# Patient Record
Sex: Female | Born: 1950 | Race: White | Hispanic: No | Marital: Married | Smoking: Never smoker
Health system: Southern US, Community
[De-identification: ages and names within clinical notes are randomized; demographics above are authoritative.]

## PROBLEM LIST (undated history)

## (undated) DIAGNOSIS — G4700 Insomnia, unspecified: Secondary | ICD-10-CM

## (undated) DIAGNOSIS — H04129 Dry eye syndrome of unspecified lacrimal gland: Secondary | ICD-10-CM

## (undated) DIAGNOSIS — E785 Hyperlipidemia, unspecified: Secondary | ICD-10-CM

## (undated) DIAGNOSIS — E538 Deficiency of other specified B group vitamins: Secondary | ICD-10-CM

## (undated) DIAGNOSIS — I10 Essential (primary) hypertension: Secondary | ICD-10-CM

## (undated) DIAGNOSIS — F329 Major depressive disorder, single episode, unspecified: Secondary | ICD-10-CM

## (undated) HISTORY — DX: Insomnia, unspecified: G47.00

## (undated) HISTORY — DX: Major depressive disorder, single episode, unspecified: F32.9

## (undated) HISTORY — DX: Essential (primary) hypertension: I10

## (undated) HISTORY — PX: BREAST BIOPSY: SHX20

## (undated) HISTORY — DX: Hyperlipidemia, unspecified: E78.5

## (undated) HISTORY — DX: Deficiency of other specified B group vitamins: E53.8

## (undated) HISTORY — DX: Dry eye syndrome of unspecified lacrimal gland: H04.129

---

## 2017-06-03 DIAGNOSIS — K219 Gastro-esophageal reflux disease without esophagitis: Secondary | ICD-10-CM | POA: Diagnosis not present

## 2017-06-03 DIAGNOSIS — K439 Ventral hernia without obstruction or gangrene: Secondary | ICD-10-CM | POA: Diagnosis not present

## 2017-06-26 DIAGNOSIS — K439 Ventral hernia without obstruction or gangrene: Secondary | ICD-10-CM | POA: Diagnosis not present

## 2017-07-02 DIAGNOSIS — J0100 Acute maxillary sinusitis, unspecified: Secondary | ICD-10-CM | POA: Diagnosis not present

## 2017-07-02 DIAGNOSIS — J069 Acute upper respiratory infection, unspecified: Secondary | ICD-10-CM | POA: Diagnosis not present

## 2017-07-09 DIAGNOSIS — J0140 Acute pansinusitis, unspecified: Secondary | ICD-10-CM | POA: Diagnosis not present

## 2017-07-29 DIAGNOSIS — K439 Ventral hernia without obstruction or gangrene: Secondary | ICD-10-CM | POA: Diagnosis not present

## 2017-10-10 DIAGNOSIS — H1032 Unspecified acute conjunctivitis, left eye: Secondary | ICD-10-CM | POA: Diagnosis not present

## 2018-02-02 DIAGNOSIS — Z23 Encounter for immunization: Secondary | ICD-10-CM | POA: Diagnosis not present

## 2018-02-17 DIAGNOSIS — E782 Mixed hyperlipidemia: Secondary | ICD-10-CM | POA: Diagnosis not present

## 2018-02-17 DIAGNOSIS — F329 Major depressive disorder, single episode, unspecified: Secondary | ICD-10-CM | POA: Diagnosis not present

## 2018-02-17 DIAGNOSIS — Z0000 Encounter for general adult medical examination without abnormal findings: Secondary | ICD-10-CM | POA: Diagnosis not present

## 2018-02-17 DIAGNOSIS — Z124 Encounter for screening for malignant neoplasm of cervix: Secondary | ICD-10-CM | POA: Diagnosis not present

## 2018-02-17 DIAGNOSIS — Z23 Encounter for immunization: Secondary | ICD-10-CM | POA: Diagnosis not present

## 2018-02-17 DIAGNOSIS — R7989 Other specified abnormal findings of blood chemistry: Secondary | ICD-10-CM | POA: Diagnosis not present

## 2018-02-17 DIAGNOSIS — E669 Obesity, unspecified: Secondary | ICD-10-CM | POA: Diagnosis not present

## 2018-02-17 DIAGNOSIS — K219 Gastro-esophageal reflux disease without esophagitis: Secondary | ICD-10-CM | POA: Diagnosis not present

## 2018-02-19 DIAGNOSIS — R7989 Other specified abnormal findings of blood chemistry: Secondary | ICD-10-CM | POA: Diagnosis not present

## 2018-02-19 DIAGNOSIS — Z79899 Other long term (current) drug therapy: Secondary | ICD-10-CM | POA: Diagnosis not present

## 2018-02-24 ENCOUNTER — Other Ambulatory Visit: Payer: Self-pay | Admitting: Family Medicine

## 2018-02-24 DIAGNOSIS — Z1231 Encounter for screening mammogram for malignant neoplasm of breast: Secondary | ICD-10-CM

## 2018-02-25 DIAGNOSIS — R7989 Other specified abnormal findings of blood chemistry: Secondary | ICD-10-CM | POA: Diagnosis not present

## 2018-02-25 DIAGNOSIS — E538 Deficiency of other specified B group vitamins: Secondary | ICD-10-CM | POA: Diagnosis not present

## 2018-03-11 DIAGNOSIS — D7589 Other specified diseases of blood and blood-forming organs: Secondary | ICD-10-CM | POA: Diagnosis not present

## 2018-03-11 DIAGNOSIS — E782 Mixed hyperlipidemia: Secondary | ICD-10-CM | POA: Diagnosis not present

## 2018-03-11 DIAGNOSIS — F329 Major depressive disorder, single episode, unspecified: Secondary | ICD-10-CM | POA: Diagnosis not present

## 2018-03-11 DIAGNOSIS — I10 Essential (primary) hypertension: Secondary | ICD-10-CM | POA: Diagnosis not present

## 2018-03-13 DIAGNOSIS — K625 Hemorrhage of anus and rectum: Secondary | ICD-10-CM | POA: Diagnosis not present

## 2018-04-08 ENCOUNTER — Ambulatory Visit
Admission: RE | Admit: 2018-04-08 | Discharge: 2018-04-08 | Disposition: A | Discharge status: Home / Self Care | Payer: Medicare Other | Source: Ambulatory Visit | Attending: Family Medicine | Admitting: Family Medicine

## 2018-04-08 DIAGNOSIS — Z1231 Encounter for screening mammogram for malignant neoplasm of breast: Secondary | ICD-10-CM | POA: Diagnosis not present

## 2018-04-21 DIAGNOSIS — E782 Mixed hyperlipidemia: Secondary | ICD-10-CM | POA: Diagnosis not present

## 2018-05-05 DIAGNOSIS — K648 Other hemorrhoids: Secondary | ICD-10-CM | POA: Diagnosis not present

## 2018-05-05 DIAGNOSIS — K5730 Diverticulosis of large intestine without perforation or abscess without bleeding: Secondary | ICD-10-CM | POA: Diagnosis not present

## 2018-05-05 DIAGNOSIS — K625 Hemorrhage of anus and rectum: Secondary | ICD-10-CM | POA: Diagnosis not present

## 2018-05-19 DIAGNOSIS — R718 Other abnormality of red blood cells: Secondary | ICD-10-CM | POA: Diagnosis not present

## 2018-05-19 DIAGNOSIS — K7689 Other specified diseases of liver: Secondary | ICD-10-CM | POA: Diagnosis not present

## 2018-05-19 DIAGNOSIS — H9201 Otalgia, right ear: Secondary | ICD-10-CM | POA: Diagnosis not present

## 2018-05-19 DIAGNOSIS — E538 Deficiency of other specified B group vitamins: Secondary | ICD-10-CM | POA: Diagnosis not present

## 2018-05-19 DIAGNOSIS — E782 Mixed hyperlipidemia: Secondary | ICD-10-CM | POA: Diagnosis not present

## 2018-05-25 DIAGNOSIS — E538 Deficiency of other specified B group vitamins: Secondary | ICD-10-CM | POA: Diagnosis not present

## 2018-06-15 DIAGNOSIS — E538 Deficiency of other specified B group vitamins: Secondary | ICD-10-CM | POA: Diagnosis not present

## 2018-09-08 DIAGNOSIS — M25562 Pain in left knee: Secondary | ICD-10-CM | POA: Diagnosis not present

## 2018-09-08 DIAGNOSIS — M25561 Pain in right knee: Secondary | ICD-10-CM | POA: Diagnosis not present

## 2018-09-08 DIAGNOSIS — I10 Essential (primary) hypertension: Secondary | ICD-10-CM | POA: Diagnosis not present

## 2018-09-08 DIAGNOSIS — E538 Deficiency of other specified B group vitamins: Secondary | ICD-10-CM | POA: Diagnosis not present

## 2018-09-08 DIAGNOSIS — G8929 Other chronic pain: Secondary | ICD-10-CM | POA: Diagnosis not present

## 2018-09-08 DIAGNOSIS — E669 Obesity, unspecified: Secondary | ICD-10-CM | POA: Diagnosis not present

## 2018-09-08 DIAGNOSIS — E782 Mixed hyperlipidemia: Secondary | ICD-10-CM | POA: Diagnosis not present

## 2018-09-15 DIAGNOSIS — E538 Deficiency of other specified B group vitamins: Secondary | ICD-10-CM | POA: Diagnosis not present

## 2018-10-15 DIAGNOSIS — E538 Deficiency of other specified B group vitamins: Secondary | ICD-10-CM | POA: Diagnosis not present

## 2018-11-06 DIAGNOSIS — H2513 Age-related nuclear cataract, bilateral: Secondary | ICD-10-CM | POA: Diagnosis not present

## 2018-11-06 DIAGNOSIS — H179 Unspecified corneal scar and opacity: Secondary | ICD-10-CM | POA: Diagnosis not present

## 2018-11-06 DIAGNOSIS — H16142 Punctate keratitis, left eye: Secondary | ICD-10-CM | POA: Diagnosis not present

## 2018-11-09 DIAGNOSIS — H179 Unspecified corneal scar and opacity: Secondary | ICD-10-CM | POA: Diagnosis not present

## 2018-11-09 DIAGNOSIS — H04123 Dry eye syndrome of bilateral lacrimal glands: Secondary | ICD-10-CM | POA: Diagnosis not present

## 2018-11-09 DIAGNOSIS — H2513 Age-related nuclear cataract, bilateral: Secondary | ICD-10-CM | POA: Diagnosis not present

## 2018-11-16 DIAGNOSIS — E538 Deficiency of other specified B group vitamins: Secondary | ICD-10-CM | POA: Diagnosis not present

## 2018-12-17 DIAGNOSIS — E538 Deficiency of other specified B group vitamins: Secondary | ICD-10-CM | POA: Diagnosis not present

## 2018-12-17 DIAGNOSIS — Z23 Encounter for immunization: Secondary | ICD-10-CM | POA: Diagnosis not present

## 2019-01-05 DIAGNOSIS — H0100A Unspecified blepharitis right eye, upper and lower eyelids: Secondary | ICD-10-CM | POA: Diagnosis not present

## 2019-01-05 DIAGNOSIS — H04123 Dry eye syndrome of bilateral lacrimal glands: Secondary | ICD-10-CM | POA: Diagnosis not present

## 2019-01-05 DIAGNOSIS — H0100B Unspecified blepharitis left eye, upper and lower eyelids: Secondary | ICD-10-CM | POA: Diagnosis not present

## 2019-01-18 DIAGNOSIS — E538 Deficiency of other specified B group vitamins: Secondary | ICD-10-CM | POA: Diagnosis not present

## 2019-01-27 DIAGNOSIS — J0110 Acute frontal sinusitis, unspecified: Secondary | ICD-10-CM | POA: Diagnosis not present

## 2019-01-31 DIAGNOSIS — Z20828 Contact with and (suspected) exposure to other viral communicable diseases: Secondary | ICD-10-CM | POA: Diagnosis not present

## 2019-02-18 DIAGNOSIS — E538 Deficiency of other specified B group vitamins: Secondary | ICD-10-CM | POA: Diagnosis not present

## 2019-04-23 DIAGNOSIS — E538 Deficiency of other specified B group vitamins: Secondary | ICD-10-CM | POA: Diagnosis not present

## 2019-05-21 DIAGNOSIS — E538 Deficiency of other specified B group vitamins: Secondary | ICD-10-CM | POA: Diagnosis not present

## 2019-07-06 ENCOUNTER — Encounter: Payer: Self-pay | Admitting: Internal Medicine

## 2019-07-06 ENCOUNTER — Ambulatory Visit (INDEPENDENT_AMBULATORY_CARE_PROVIDER_SITE_OTHER): Payer: Medicare Other | Admitting: Internal Medicine

## 2019-07-06 ENCOUNTER — Other Ambulatory Visit: Payer: Self-pay

## 2019-07-06 DIAGNOSIS — I10 Essential (primary) hypertension: Secondary | ICD-10-CM | POA: Insufficient documentation

## 2019-07-06 DIAGNOSIS — E785 Hyperlipidemia, unspecified: Secondary | ICD-10-CM | POA: Diagnosis not present

## 2019-07-06 DIAGNOSIS — F329 Major depressive disorder, single episode, unspecified: Secondary | ICD-10-CM | POA: Insufficient documentation

## 2019-07-06 DIAGNOSIS — E538 Deficiency of other specified B group vitamins: Secondary | ICD-10-CM

## 2019-07-06 DIAGNOSIS — E782 Mixed hyperlipidemia: Secondary | ICD-10-CM | POA: Insufficient documentation

## 2019-07-06 DIAGNOSIS — G4700 Insomnia, unspecified: Secondary | ICD-10-CM | POA: Insufficient documentation

## 2019-07-06 MED ORDER — VITAMIN B-12 1000 MCG PO TABS: 1000.0000 ug | ORAL_TABLET | Freq: Every day | ORAL | 0 refills | Status: DC

## 2019-07-06 NOTE — Assessment & Plan Note (Signed)
BP well controlled today on amlodipine 5mg , losartan 100mg , metoprolol XL 100mg .    -continue current regimen -CMP today

## 2019-07-06 NOTE — Assessment & Plan Note (Addendum)
It has been over a year since her last lipid profile.  She is on atorvastatin 80mg  and fenofibrate 48.  No records but I assume she has high triglycerides as well.  No abdominal pain or myalgia symptoms.    -lipid profile today -cmp  -continue current regimen for now

## 2019-07-06 NOTE — Progress Notes (Signed)
New Patient Office Visit  Subjective:  Patient ID: Brenda Powell, female    DOB: 06/23/1950  Age: 69 y.o. MRN: [id number]  CC:  Chief Complaint  Patient presents with  . Follow-up    PATIENT IS NEW TO CLINIC TO GET ESTABLISHED-FORMER PATIENT [facility]    HPI Brenda Powell presents for follow up to establish care.  In addition today we will address B12 deficiency.  Past Medical History:  Diagnosis Date  . B12 deficiency   . Dry eye   . HTN (hypertension)   . Hyperlipidemia   . Insomnia   . MDD (major depressive disorder)     Past Surgical History:  Procedure Laterality Date  . BREAST BIOPSY Left   . HERNIA REPAIR  10/2017  . 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 TOTAL KNEE ARTHROPLASTY Left 07/2007    Family History  Problem Relation Age of Onset  . Congestive Heart Failure Father     Social History   Socioeconomic History  . Marital status: Married    Spouse name: Not on file  . Number of children: Not on file  . Years of education: Not on file  . Highest education level: Not on file  Occupational History  . Not on file  Tobacco Use  . Smoking status: Never Smoker  . Smokeless tobacco: Never Used  Substance and Sexual Activity  . Alcohol use: Yes    Alcohol/week: 7.0 standard drinks    Types: 7 Glasses of wine per week  . Drug use: Never  . Sexual activity: Yes    Partners: Male  Other Topics Concern  . Not on file  Social History Narrative  . Not on file   Social Determinants of Health   Financial Resource Strain:   . Difficulty of Paying Living Expenses:   Food Insecurity:   . Worried About Charity fundraiser in the Last Year:   . Arboriculturist in the Last Year:   Transportation Needs:   . Film/video editor (Medical):   Marland Kitchen Lack of Transportation (Non-Medical):   Physical Activity:   . Days of Exercise per Week:   . Minutes of Exercise per Session:   Stress:   . Feeling of Stress :   Social Connections:   . Frequency of  Communication with Friends and Family:   . Frequency of Social Gatherings with Friends and Family:   . Attends Religious Services:   . Active Member of Clubs or Organizations:   . Attends Archivist Meetings:   Marland Kitchen Marital Status:   Intimate Partner Violence:   . Fear of Current or Ex-Partner:   . Emotionally Abused:   Marland Kitchen Physically Abused:   . Sexually Abused:     ROS Review of Systems  Constitutional: Negative for activity change and appetite change.  HENT: Negative for congestion.   Eyes: Negative for discharge and itching.  Respiratory: Negative for chest tightness and shortness of breath.   Cardiovascular: Negative for chest pain.  Gastrointestinal: Negative for abdominal pain.  Endocrine: Negative for cold intolerance.  Genitourinary: Negative for flank pain.  Musculoskeletal: Negative for neck pain.  Allergic/Immunologic: Negative for environmental allergies and food allergies.  Neurological: Negative for dizziness.  Hematological: Does not bruise/bleed easily.  Psychiatric/Behavioral: Negative for dysphoric mood.    Objective:   Today's Vitals: BP 116/60 (BP Location: Left Arm, Cuff Size: Large)   Pulse 65   Temp 98.2 F (36.8 C) (Oral)   Ht 5\' 5"  (1.651  m)   Wt 224 lb 12.8 oz (102 kg)   SpO2 95%   BMI 37.41 kg/m   Physical Exam Constitutional:      General: She is not in acute distress.    Appearance: She is not diaphoretic.  Cardiovascular:     Rate and Rhythm: Normal rate and regular rhythm.     Heart sounds: Normal heart sounds. No murmur. No friction rub. No gallop.   Pulmonary:     Effort: Pulmonary effort is normal. No respiratory distress.     Breath sounds: Normal breath sounds. No wheezing or rales.  Chest:     Chest wall: No tenderness.  Abdominal:     General: Bowel sounds are normal. There is no distension.     Palpations: Abdomen is soft. There is no mass.     Tenderness: There is no abdominal tenderness. There is no guarding or  rebound.  Neurological:     Mental Status: She is alert. Mental status is at baseline.     Assessment & Plan:   Problem List Items Addressed This Visit      Cardiovascular and Mediastinum   HTN (hypertension)    BP well controlled today on amlodipine 5mg , losartan 100mg , metoprolol XL 100mg .    -continue current regimen -CMP today      Relevant Medications   atorvastatin (LIPITOR) 80 MG tablet   amLODipine (NORVASC) 5 MG tablet   fenofibrate (TRICOR) 48 MG tablet   losartan (COZAAR) 100 MG tablet   metoprolol succinate (TOPROL-XL) 100 MG 24 hr tablet   Other Relevant Orders   CMP14 + Anion Gap   CBC no Diff     Other   Hyperlipidemia    It has been over a year since her last lipid profile.  She is on atorvastatin 80mg  and fenofibrate 48.  No records but I assume she has high triglycerides as well.  No abdominal pain or myalgia symptoms.    -lipid profile today -cmp  -continue current regimen for now      Relevant Medications   atorvastatin (LIPITOR) 80 MG tablet   amLODipine (NORVASC) 5 MG tablet   fenofibrate (TRICOR) 48 MG tablet   losartan (COZAAR) 100 MG tablet   metoprolol succinate (TOPROL-XL) 100 MG 24 hr tablet   Other Relevant Orders   Lipid Profile   CMP14 + Anion Gap   B12 deficiency    Pt reports diagnosis many years ago.  No history of GI tract pathology or gastric bypass.  She has been on B12 injections for years.  -switch to oral B12 and we will monitor her levels in a few months      Relevant Medications   vitamin B-12 (CYANOCOBALAMIN) 1000 MCG tablet   Other Relevant Orders   CBC no Diff      Outpatient Encounter Medications as of 07/06/2019  Medication Sig  . amLODipine (NORVASC) 5 MG tablet Take 5 mg by mouth daily.  atorvastatin (LIPITOR) 80 MG tablet Take 80 mg by mouth daily.  . cycloSPORINE (RESTASIS) 0.05 % ophthalmic emulsion 1 drop 2 (two) times daily.  . fenofibrate (TRICOR) 48 MG tablet Take 48 mg by mouth daily.   FLUoxetine (PROZAC) 40 MG capsule Take 40 mg by mouth daily.  losartan (COZAAR) 100 MG tablet Take 100 mg by mouth daily.  . metoprolol succinate (TOPROL-XL) 100 MG 24 hr tablet Take 100 mg by mouth daily. Take with or immediately following a meal.  . temazepam (RESTORIL) 15  MG capsule Take 15 mg by mouth at bedtime as needed.  . vitamin B-12 (CYANOCOBALAMIN) 1000 MCG tablet Take 1 tablet (1,000 mcg total) by mouth daily.   No facility-administered encounter medications on file as of 07/06/2019.    Follow-up: Return in about 3 months (around 10/05/2019).   Thornell Mule, MD

## 2019-07-06 NOTE — Assessment & Plan Note (Signed)
Pt reports diagnosis many years ago.  No history of GI tract pathology or gastric bypass.  She has been on B12 injections for years.  -switch to oral B12 and we will monitor her levels in a few months

## 2019-07-06 NOTE — Patient Instructions (Signed)
Brenda Powell it was great to meet you.  I have put in most of your history into our epic charting system today.   It will be important to have your records transferred to our clinic.  You will need to fill out some paperwork with your current PCP office in order to do so.  I have started you on B12 tablets and we can keep an eye on your B12 levels with labwork.  We will get some baseline labs today.

## 2019-07-07 ENCOUNTER — Telehealth: Payer: Self-pay | Admitting: Internal Medicine

## 2019-07-07 LAB — CBC
Hematocrit: 43.4 % (ref 34.0–46.6)
Hemoglobin: 14.6 g/dL (ref 11.1–15.9)
MCH: 33.1 pg — ABNORMAL HIGH (ref 26.6–33.0)
MCHC: 33.6 g/dL (ref 31.5–35.7)
MCV: 98 fL — ABNORMAL HIGH (ref 79–97)
Platelets: 359 10*3/uL (ref 150–450)
RBC: 4.41 x10E6/uL (ref 3.77–5.28)
RDW: 12.6 % (ref 11.7–15.4)
WBC: 6.2 10*3/uL (ref 3.4–10.8)

## 2019-07-07 LAB — CMP14 + ANION GAP
ALT: 32 IU/L (ref 0–32)
AST: 42 IU/L — ABNORMAL HIGH (ref 0–40)
Albumin/Globulin Ratio: 1.8 (ref 1.2–2.2)
Albumin: 3.9 g/dL (ref 3.8–4.8)
Alkaline Phosphatase: 60 IU/L (ref 39–117)
Anion Gap: 13 mmol/L (ref 10.0–18.0)
BUN/Creatinine Ratio: 16 (ref 12–28)
BUN: 18 mg/dL (ref 8–27)
Bilirubin Total: 0.4 mg/dL (ref 0.0–1.2)
CO2: 22 mmol/L (ref 20–29)
Calcium: 9.1 mg/dL (ref 8.7–10.3)
Chloride: 102 mmol/L (ref 96–106)
Creatinine, Ser: 1.11 mg/dL — ABNORMAL HIGH (ref 0.57–1.00)
GFR calc Af Amer: 59 mL/min/{1.73_m2} — ABNORMAL LOW (ref >59)
GFR calc non Af Amer: 52 mL/min/{1.73_m2} — ABNORMAL LOW (ref >59)
Globulin, Total: 2.2 g/dL (ref 1.5–4.5)
Glucose: 90 mg/dL (ref 65–99)
Potassium: 4.6 mmol/L (ref 3.5–5.2)
Sodium: 137 mmol/L (ref 134–144)
Total Protein: 6.1 g/dL (ref 6.0–8.5)

## 2019-07-07 LAB — LIPID PANEL
Chol/HDL Ratio: 3.5 ratio (ref 0.0–4.4)
Cholesterol, Total: 156 mg/dL (ref 100–199)
HDL: 45 mg/dL (ref >39)
LDL Chol Calc (NIH): 86 mg/dL (ref 0–99)
Triglycerides: 145 mg/dL (ref 0–149)
VLDL Cholesterol Cal: 25 mg/dL (ref 5–40)

## 2019-07-07 NOTE — Telephone Encounter (Signed)
Spoke with pt about cbc, cmp and lipid profile results.  She is still working on getting records trasnferred for comparison but overall lab work reassuring and likely at her baseline.  I would like to see her LDL slightly lower but we can compare with prior records and make a decision at her next visit if we would like to pursue any changes in therapy.

## 2019-07-07 NOTE — Progress Notes (Signed)
Internal Medicine Clinic Attending  Case discussed with Dr. Winfrey  at the time of the visit.  We reviewed the resident's history and exam and pertinent patient test results.  I agree with the assessment, diagnosis, and plan of care documented in the resident's note.  

## 2019-09-09 ENCOUNTER — Encounter: Payer: Self-pay | Admitting: Internal Medicine

## 2019-09-09 ENCOUNTER — Other Ambulatory Visit: Payer: Self-pay

## 2019-09-09 ENCOUNTER — Ambulatory Visit (INDEPENDENT_AMBULATORY_CARE_PROVIDER_SITE_OTHER): Payer: Medicare Other | Admitting: Internal Medicine

## 2019-09-09 VITALS — BP 125/89 | HR 72 | Temp 98.2°F | Wt 224.8 lb

## 2019-09-09 DIAGNOSIS — I10 Essential (primary) hypertension: Secondary | ICD-10-CM | POA: Diagnosis present

## 2019-09-09 DIAGNOSIS — E538 Deficiency of other specified B group vitamins: Secondary | ICD-10-CM | POA: Diagnosis not present

## 2019-09-09 DIAGNOSIS — Z0000 Encounter for general adult medical examination without abnormal findings: Secondary | ICD-10-CM

## 2019-09-09 DIAGNOSIS — F5104 Psychophysiologic insomnia: Secondary | ICD-10-CM

## 2019-09-09 DIAGNOSIS — E785 Hyperlipidemia, unspecified: Secondary | ICD-10-CM

## 2019-09-09 DIAGNOSIS — G4700 Insomnia, unspecified: Secondary | ICD-10-CM | POA: Diagnosis not present

## 2019-09-09 DIAGNOSIS — R739 Hyperglycemia, unspecified: Secondary | ICD-10-CM

## 2019-09-09 DIAGNOSIS — N189 Chronic kidney disease, unspecified: Secondary | ICD-10-CM

## 2019-09-09 LAB — POCT GLYCOSYLATED HEMOGLOBIN (HGB A1C): Hemoglobin A1C: 5.6 % (ref 4.0–5.6)

## 2019-09-09 LAB — GLUCOSE, CAPILLARY: Glucose-Capillary: 89 mg/dL (ref 70–99)

## 2019-09-09 NOTE — Progress Notes (Signed)
CC: HTN  HPI:  [name] is a 69 y.o. female with a past medical history stated below and presents today for HTN. Please see problem based assessment and plan for additional details.   Past Medical History:  Diagnosis Date  . B12 deficiency   . Dry eye   . HTN (hypertension)   . Hyperlipidemia   . Insomnia   . MDD (major depressive disorder)     Family History  Problem Relation Age of Onset  . Congestive Heart Failure Father      Social History   Socioeconomic History  . Marital status: Married    Spouse name: Not on file  . Number of children: Not on file  . Years of education: Not on file  . Highest education level: Not on file  Occupational History  . Not on file  Tobacco Use  . Smoking status: Never Smoker  . Smokeless tobacco: Never Used  Substance and Sexual Activity  . Alcohol use: Yes    Alcohol/week: 7.0 standard drinks    Types: 7 Glasses of wine per week  . Drug use: Never  . Sexual activity: Yes    Partners: Male  Other Topics Concern  . Not on file  Social History Narrative  . Not on file   Social Determinants of Health   Financial Resource Strain:   . Difficulty of Paying Living Expenses:   Food Insecurity:   . Worried About Programme researcher, broadcasting/film/video in the Last Year:   . Barista in the Last Year:   Transportation Needs:   . Freight forwarder (Medical):   Marland Kitchen Lack of Transportation (Non-Medical):   Physical Activity:   . Days of Exercise per Week:   . Minutes of Exercise per Session:   Stress:   . Feeling of Stress :   Social Connections:   . Frequency of Communication with Friends and Family:   . Frequency of Social Gatherings with Friends and Family:   . Attends Religious Services:   . Active Member of Clubs or Organizations:   . Attends Banker Meetings:   Marland Kitchen Marital Status:   Intimate Partner Violence:   . Fear of Current or Ex-Partner:   . Emotionally Abused:   Marland Kitchen Physically Abused:   . Sexually  Abused:     Review of Systems: ROS - negative except what is noted in A/P   There were no vitals filed for this visit.   Physical Exam: Physical Exam HENT:     Head: Normocephalic and atraumatic.  Neck:     Trachea: No tracheal deviation.  Cardiovascular:     Rate and Rhythm: Normal rate.     Heart sounds: No murmur heard.  No friction rub. No gallop.   Pulmonary:     Effort: Pulmonary effort is normal. No respiratory distress.     Breath sounds: Normal breath sounds.  Abdominal:     General: Bowel sounds are normal. There is no distension.     Palpations: Abdomen is soft.     Tenderness: There is no abdominal tenderness.  Musculoskeletal:        General: No tenderness. Normal range of motion.  Skin:    General: Skin is warm and dry.  Neurological:     Mental Status: She is alert and oriented to person, place, and time.      Assessment & Plan:   See Encounters Tab for problem based charting.  Patient discussed with Dr. Oswaldo Done

## 2019-09-09 NOTE — Assessment & Plan Note (Addendum)
Patients last lipid panel showed:  Lipid Panel     Component Value Date/Time   CHOL 156 07/06/2019 1457   TRIG 145 07/06/2019 1457   HDL 45 07/06/2019 1457   CHOLHDL 3.5 07/06/2019 1457   LDLCALC 86 07/06/2019 1457   LABVLDL 25 07/06/2019 1457   She is currently taking atorvastatin 80 mg daily and fenofibrate 48 mg daily. She denies any side effects from these medications. She denies having a previous history of triglyceride induced pancreatitis.   Her ASCVD risk is 5.5% currently.   Plan: - Continue atorvastatin 80 mg - Counseled her on stopping fenofibrate at this time.

## 2019-09-09 NOTE — Patient Instructions (Signed)
Thank you, [name] for allowing [place] to provide your care today. Today we discussed blood pressure, kidney disease, B12 deficiency.    I have ordered complete metabolic panel, B12 level, hepatitis C screening, microalbumin/creatinine ratio hemoglobin A1c.  Labs for you. I will call if any are abnormal.    I have place a referrals to none today.  I have ordered the following tests: None today  I have ordered the following medication/changed the following medications:  -Fenofibrate.  Please follow-up in 6 months or sooner if acute concerns..    Should you have any questions or concerns please call the internal medicine clinic at [phone].    Dellia Cloud, D.O. [facility] Internal Medicine

## 2019-09-09 NOTE — Assessment & Plan Note (Addendum)
Patients blood pressure is well controlled today. She is tolerating her medications well without side effects.   Current blood pressure: Today's Vitals   09/09/19 1316  BP: 125/89  Pulse: 72  Temp: 98.2 F (36.8 C)  TempSrc: Oral  SpO2: 97%  Weight: 224 lb 12.8 oz (102 kg)   Body mass index is 37.41 kg/m.  Plan: - Continue Losartan 100 mg daily - Continue amlodipine 5 mg daily

## 2019-09-10 LAB — CMP14 + ANION GAP
ALT: 42 IU/L — ABNORMAL HIGH (ref 0–32)
AST: 64 IU/L — ABNORMAL HIGH (ref 0–40)
Albumin/Globulin Ratio: 1.8 (ref 1.2–2.2)
Albumin: 4.2 g/dL (ref 3.8–4.8)
Alkaline Phosphatase: 55 IU/L (ref 48–121)
Anion Gap: 19 mmol/L — ABNORMAL HIGH (ref 10.0–18.0)
BUN/Creatinine Ratio: 16 (ref 12–28)
BUN: 16 mg/dL (ref 8–27)
Bilirubin Total: 0.5 mg/dL (ref 0.0–1.2)
CO2: 20 mmol/L (ref 20–29)
Calcium: 9.1 mg/dL (ref 8.7–10.3)
Chloride: 101 mmol/L (ref 96–106)
Creatinine, Ser: 1.01 mg/dL — ABNORMAL HIGH (ref 0.57–1.00)
GFR calc Af Amer: 67 mL/min/{1.73_m2} (ref >59)
GFR calc non Af Amer: 58 mL/min/{1.73_m2} — ABNORMAL LOW (ref >59)
Globulin, Total: 2.4 g/dL (ref 1.5–4.5)
Glucose: 82 mg/dL (ref 65–99)
Potassium: 4.4 mmol/L (ref 3.5–5.2)
Sodium: 140 mmol/L (ref 134–144)
Total Protein: 6.6 g/dL (ref 6.0–8.5)

## 2019-09-10 LAB — MICROALBUMIN / CREATININE URINE RATIO
Creatinine, Urine: 355.8 mg/dL
Microalb/Creat Ratio: 16 mg/g creat (ref 0–29)
Microalbumin, Urine: 57.8 ug/mL

## 2019-09-10 LAB — VITAMIN B12: Vitamin B-12: 1038 pg/mL (ref 232–1245)

## 2019-09-10 LAB — HEPATITIS C ANTIBODY: Hep C Virus Ab: <0.1 s/co ratio (ref 0.0–0.9)

## 2019-09-13 ENCOUNTER — Encounter: Payer: Self-pay | Admitting: Internal Medicine

## 2019-09-13 DIAGNOSIS — K219 Gastro-esophageal reflux disease without esophagitis: Secondary | ICD-10-CM | POA: Insufficient documentation

## 2019-09-13 DIAGNOSIS — K648 Other hemorrhoids: Secondary | ICD-10-CM | POA: Insufficient documentation

## 2019-09-13 DIAGNOSIS — K5730 Diverticulosis of large intestine without perforation or abscess without bleeding: Secondary | ICD-10-CM | POA: Insufficient documentation

## 2019-09-13 NOTE — Assessment & Plan Note (Signed)
Patient states that she has a history of intermittent insomnia, which she believes is associated with anxiety/depression. She states that she has a "difficult time turing her thoughts off" when she lays down at night. She is taking temazepam 15 mg qHS prn. She denies recent falls or disorientation while taking this medication.   Plan: 1. Continue Temazepam

## 2019-09-13 NOTE — Assessment & Plan Note (Signed)
Has a history of B12 deficiency.   Plan: - Check B12 level today.

## 2019-09-21 NOTE — Progress Notes (Signed)
Internal Medicine Clinic Attending  Case discussed with Dr. Coe at the time of the visit.  We reviewed the resident's history and exam and pertinent patient test results.  I agree with the assessment, diagnosis, and plan of care documented in the resident's note.    

## 2019-10-08 ENCOUNTER — Other Ambulatory Visit: Payer: Self-pay | Admitting: Internal Medicine

## 2019-10-08 DIAGNOSIS — E538 Deficiency of other specified B group vitamins: Secondary | ICD-10-CM

## 2019-11-01 ENCOUNTER — Encounter: Payer: Self-pay | Admitting: Internal Medicine

## 2019-11-02 NOTE — Telephone Encounter (Signed)
Attempted to call pt, lm for rtc 

## 2019-11-22 ENCOUNTER — Other Ambulatory Visit: Payer: Self-pay

## 2019-11-22 MED ORDER — LOSARTAN POTASSIUM 100 MG PO TABS: 100.0000 mg | ORAL_TABLET | Freq: Every day | ORAL | 1 refills | Status: DC

## 2019-11-22 MED ORDER — ATORVASTATIN CALCIUM 80 MG PO TABS: 80.0000 mg | ORAL_TABLET | Freq: Every day | ORAL | 1 refills | Status: DC

## 2019-11-22 MED ORDER — AMLODIPINE BESYLATE 5 MG PO TABS: 5.0000 mg | ORAL_TABLET | Freq: Every day | ORAL | 1 refills | Status: DC

## 2019-12-16 ENCOUNTER — Ambulatory Visit (INDEPENDENT_AMBULATORY_CARE_PROVIDER_SITE_OTHER): Payer: Medicare Other | Admitting: *Deleted

## 2019-12-16 ENCOUNTER — Other Ambulatory Visit: Payer: Self-pay

## 2019-12-16 ENCOUNTER — Other Ambulatory Visit: Payer: Self-pay | Admitting: *Deleted

## 2019-12-16 DIAGNOSIS — Z23 Encounter for immunization: Secondary | ICD-10-CM

## 2019-12-16 NOTE — Telephone Encounter (Signed)
It appears she was getting this medication from [facility] even after seeing her PCP here in June. Is [facility] her PCP? If so, she would continue getting this filled with them.

## 2019-12-21 ENCOUNTER — Other Ambulatory Visit: Payer: Self-pay

## 2019-12-21 DIAGNOSIS — H18832 Recurrent erosion of cornea, left eye: Secondary | ICD-10-CM | POA: Diagnosis not present

## 2019-12-21 MED ORDER — TEMAZEPAM 15 MG PO CAPS: 15.0000 mg | ORAL_CAPSULE | Freq: Every evening | ORAL | 0 refills | Status: DC | PRN

## 2019-12-21 NOTE — Telephone Encounter (Signed)
temazepam (RESTORIL) 15 MG capsule, REFILL REQUEST @  [id number] - [place], [place] - [address] AT [facility] [address] Phone:  [phone]  Fax:  [phone]     Pt states the pharmacy is waiting for a reply from the clinic.

## 2019-12-21 NOTE — Telephone Encounter (Signed)
Called pt - stated she no longer goes to [facility], all of her records have been forwarded to [place]. Stated Deboraha Sprang refused to refill. And she's totally out of med. Thanks

## 2019-12-21 NOTE — Telephone Encounter (Signed)
Patient was seen in clinic by Dr. Dellia Cloud on 09/13/2019 with plan to continue temazepam for insomnia per review of patient's problem list. She has a follow-up appointment scheduled on 12/23/19. A 30 tablet refill was provided for patient today without refills as patient will be evaluated in clinic in two days to confirm that this medication is appropriate for our clinic to continue prescribing.

## 2019-12-23 ENCOUNTER — Other Ambulatory Visit: Payer: Self-pay

## 2019-12-23 ENCOUNTER — Encounter: Payer: Self-pay | Admitting: Student

## 2019-12-23 ENCOUNTER — Ambulatory Visit (INDEPENDENT_AMBULATORY_CARE_PROVIDER_SITE_OTHER): Payer: Medicare Other | Admitting: Student

## 2019-12-23 VITALS — BP 103/74 | HR 77 | Temp 98.1°F | Ht 65.0 in | Wt 220.3 lb

## 2019-12-23 DIAGNOSIS — K219 Gastro-esophageal reflux disease without esophagitis: Secondary | ICD-10-CM | POA: Diagnosis not present

## 2019-12-23 DIAGNOSIS — F5104 Psychophysiologic insomnia: Secondary | ICD-10-CM | POA: Diagnosis not present

## 2019-12-23 DIAGNOSIS — I10 Essential (primary) hypertension: Secondary | ICD-10-CM

## 2019-12-23 DIAGNOSIS — E785 Hyperlipidemia, unspecified: Secondary | ICD-10-CM | POA: Diagnosis not present

## 2019-12-23 MED ORDER — TEMAZEPAM 7.5 MG PO CAPS
7.5000 mg | ORAL_CAPSULE | Freq: Every evening | ORAL | 2 refills | Status: DC | PRN
Start: 2019-12-23 — End: 2020-04-19

## 2019-12-23 MED ORDER — METOPROLOL SUCCINATE ER 50 MG PO TB24
50.0000 mg | ORAL_TABLET | Freq: Every day | ORAL | 2 refills | Status: DC
Start: 2019-12-23 — End: 2020-03-20

## 2019-12-23 NOTE — Assessment & Plan Note (Addendum)
Patient states that she goes to bed at midnight or 1am every night and will sleep until 10am. She also has frequent awakenings throughout the night. She previously took [place] for her insomnia, but was transitioned to temazepam 15mg  nightly PRN. She states that she doesn't need temazepam every night for sleep and would be interested in cutting back the dose. Additionally, we discussed limiting alcohol consumption prior to bed and the adverse reactions associated with this medication. We discussed limiting fluid intake within a few hours before bed to decrease the need for urination in the middle of the night. -Sleep Hygiene discussed -Temazepam 7.5mg  nightly PRN decreased from 15mg  -Continue prozac 40mg  daily for anxiety -Re-evaluate at next visit

## 2019-12-23 NOTE — Assessment & Plan Note (Signed)
Patient reports tolerating atorvastatin 80mg  well following discontinuation of fenofibrate. -Continue lipitor 80mg  daily

## 2019-12-23 NOTE — Patient Instructions (Addendum)
Dear Ms. Lins,  It was a pleasure meeting you in clinic today. We will cut down the dose of your metoprolol from 100mg  to 50mg  because your blood pressure is very well controlled. Also, we have decreased your temazepam dose from 15mg  to 7.5mg . Otherwise, we will see you back in clinic in three months! Please, call our office with any questions or concerns.  Sincerely, Dr. , MD

## 2019-12-23 NOTE — Assessment & Plan Note (Signed)
Patient's blood pressure well within target (103/74) while on three antihypertensives (amlodipine 5mg , metoprolol 100mg , and losartan 100mg ). Therefore, she would likely benefit from de-escalating her antihypertensive regimen. -Decrease metoprolol from 100mg  to 50mg  -Continue amlodipine 5mg , can consider increasing to 10mg  at next visit if blood pressure escalated -Continue losartan 100mg  daily -Recheck blood pressure and BMP at next visit

## 2019-12-23 NOTE — Assessment & Plan Note (Signed)
Patient endorses history of GERD with recent worsening of her symptoms. She states that she started taking prilosec 20mg  over-the-counter with resolution of her symptoms over the past few days. -Continue taking omeprazole 20mg  PRN

## 2019-12-23 NOTE — Progress Notes (Signed)
   CC: Follow-up  HPI:  [name] is a 69 y.o. with past medical history of HTN, HLD, insomnia, anxiety, B12 deficiency who presents to clinic for follow-up. Refer to problem list for A&P charting.  Past Medical History:  Diagnosis Date  . B12 deficiency   . Dry eye   . HTN (hypertension)   . Hyperlipidemia   . Insomnia   . MDD (major depressive disorder)    Review of Systems:  Endorses burping, chest discomfort after meals. Denies lightheadedness, dizziness, shortness of breath, cough.  Physical Exam:  Vitals:   12/23/19 1547  BP: 103/74  Pulse: 77  Temp: 98.1 F (36.7 C)  TempSrc: Oral  SpO2: 97%  Weight: 220 lb 4.8 oz (99.9 kg)  Height: 5\' 5"  (1.651 m)   Physical Exam Constitutional:      Appearance: Normal appearance.  Eyes:     Extraocular Movements: Extraocular movements intact.     Conjunctiva/sclera: Conjunctivae normal.  Cardiovascular:     Rate and Rhythm: Normal rate and regular rhythm.     Pulses: Normal pulses.     Heart sounds: Normal heart sounds.  Pulmonary:     Effort: Pulmonary effort is normal.     Breath sounds: Normal breath sounds.  Abdominal:     General: Abdomen is flat. Bowel sounds are normal.     Palpations: Abdomen is soft.  Musculoskeletal:        General: Normal range of motion.     Cervical back: Normal range of motion and neck supple.  Skin:    General: Skin is warm and dry.     Capillary Refill: Capillary refill takes less than 2 seconds.  Neurological:     General: No focal deficit present.     Mental Status: She is alert. Mental status is at baseline.  Psychiatric:        Mood and Affect: Mood normal.        Behavior: Behavior normal.        Thought Content: Thought content normal.        Judgment: Judgment normal.     Assessment & Plan:   See Encounters Tab for problem based charting.  Patient seen with Dr. 

## 2019-12-27 NOTE — Progress Notes (Signed)
DOS 12/23/19:  Internal Medicine Clinic Attending  I saw and evaluated the patient.  I personally confirmed the key portions of the history and exam documented by Dr. Johnson and I reviewed pertinent patient test results.  The assessment, diagnosis, and plan were formulated together and I agree with the documentation in the resident's note.  

## 2020-01-11 ENCOUNTER — Other Ambulatory Visit: Payer: Self-pay | Admitting: Internal Medicine

## 2020-01-11 DIAGNOSIS — E538 Deficiency of other specified B group vitamins: Secondary | ICD-10-CM

## 2020-01-20 DIAGNOSIS — H0100A Unspecified blepharitis right eye, upper and lower eyelids: Secondary | ICD-10-CM | POA: Diagnosis not present

## 2020-01-20 DIAGNOSIS — H18832 Recurrent erosion of cornea, left eye: Secondary | ICD-10-CM | POA: Diagnosis not present

## 2020-01-20 DIAGNOSIS — H04123 Dry eye syndrome of bilateral lacrimal glands: Secondary | ICD-10-CM | POA: Diagnosis not present

## 2020-01-20 DIAGNOSIS — H0100B Unspecified blepharitis left eye, upper and lower eyelids: Secondary | ICD-10-CM | POA: Diagnosis not present

## 2020-02-08 ENCOUNTER — Other Ambulatory Visit: Payer: Self-pay | Admitting: Student

## 2020-03-02 DIAGNOSIS — Z23 Encounter for immunization: Secondary | ICD-10-CM | POA: Diagnosis not present

## 2020-03-20 ENCOUNTER — Other Ambulatory Visit: Payer: Self-pay

## 2020-03-20 MED ORDER — METOPROLOL SUCCINATE ER 50 MG PO TB24: 50.0000 mg | ORAL_TABLET | Freq: Every day | ORAL | 1 refills | Status: DC

## 2020-03-20 NOTE — Telephone Encounter (Signed)
  metoprolol succinate (TOPROL XL) 50 MG 24 hr tablet, refill request @  [id number] - [place], [place] - [address] AT [facility] [address] [facility] [address] Phone:  [phone]  Fax:  [phone]     Pt states she is completely out of this med, please call pt back.

## 2020-04-05 DIAGNOSIS — R0981 Nasal congestion: Secondary | ICD-10-CM | POA: Diagnosis not present

## 2020-04-05 DIAGNOSIS — Z03818 Encounter for observation for suspected exposure to other biological agents ruled out: Secondary | ICD-10-CM | POA: Diagnosis not present

## 2020-04-05 DIAGNOSIS — J0140 Acute pansinusitis, unspecified: Secondary | ICD-10-CM | POA: Diagnosis not present

## 2020-04-05 DIAGNOSIS — Z20822 Contact with and (suspected) exposure to covid-19: Secondary | ICD-10-CM | POA: Diagnosis not present

## 2020-04-05 DIAGNOSIS — U071 COVID-19: Secondary | ICD-10-CM | POA: Diagnosis not present

## 2020-04-19 ENCOUNTER — Other Ambulatory Visit: Payer: Self-pay

## 2020-04-19 DIAGNOSIS — E538 Deficiency of other specified B group vitamins: Secondary | ICD-10-CM

## 2020-04-19 MED ORDER — AMLODIPINE BESYLATE 5 MG PO TABS
ORAL_TABLET | ORAL | 1 refills | Status: DC
Start: 2020-04-19 — End: 2021-05-24

## 2020-04-19 MED ORDER — TEMAZEPAM 7.5 MG PO CAPS: 7.5000 mg | ORAL_CAPSULE | Freq: Every evening | ORAL | 0 refills | Status: DC | PRN

## 2020-04-19 MED ORDER — METOPROLOL SUCCINATE ER 50 MG PO TB24: 50.0000 mg | ORAL_TABLET | Freq: Every day | ORAL | 1 refills | Status: DC

## 2020-04-19 MED ORDER — ATORVASTATIN CALCIUM 80 MG PO TABS: ORAL_TABLET | ORAL | 1 refills | Status: DC

## 2020-04-19 MED ORDER — FLUOXETINE HCL 40 MG PO CAPS: 40.0000 mg | ORAL_CAPSULE | Freq: Every day | ORAL | 2 refills | Status: DC

## 2020-04-19 MED ORDER — VITAMIN B-12 1000 MCG PO TABS: ORAL_TABLET | ORAL | 0 refills | Status: DC

## 2020-04-19 MED ORDER — LOSARTAN POTASSIUM 100 MG PO TABS: ORAL_TABLET | ORAL | 1 refills | Status: DC

## 2020-04-19 NOTE — Telephone Encounter (Signed)
We need to make sure patient gets regular follow ups every 3 months for her temazepam medication.

## 2020-04-19 NOTE — Telephone Encounter (Signed)
Received TC from patient who states she is transferring pharmacies from [place] to [facility] [place].  Pt states she has been trying to get her RX's transferred since before Christmas and [facility] has not been cooperative.  She is requesting a refill on all of her RX's.   SChaplin, RN,BSN

## 2020-05-22 ENCOUNTER — Ambulatory Visit (INDEPENDENT_AMBULATORY_CARE_PROVIDER_SITE_OTHER): Payer: Medicare Other | Admitting: Student

## 2020-05-22 ENCOUNTER — Encounter: Payer: Self-pay | Admitting: Student

## 2020-05-22 ENCOUNTER — Other Ambulatory Visit: Payer: Self-pay

## 2020-05-22 VITALS — BP 107/72 | HR 86 | Temp 98.5°F | Ht 65.0 in | Wt 213.0 lb

## 2020-05-22 DIAGNOSIS — F5104 Psychophysiologic insomnia: Secondary | ICD-10-CM

## 2020-05-22 DIAGNOSIS — Z1231 Encounter for screening mammogram for malignant neoplasm of breast: Secondary | ICD-10-CM

## 2020-05-22 DIAGNOSIS — Z7689 Persons encountering health services in other specified circumstances: Secondary | ICD-10-CM | POA: Diagnosis not present

## 2020-05-22 DIAGNOSIS — Z0000 Encounter for general adult medical examination without abnormal findings: Secondary | ICD-10-CM | POA: Diagnosis not present

## 2020-05-22 DIAGNOSIS — I10 Essential (primary) hypertension: Secondary | ICD-10-CM | POA: Diagnosis not present

## 2020-05-22 DIAGNOSIS — Z23 Encounter for immunization: Secondary | ICD-10-CM

## 2020-05-22 NOTE — Assessment & Plan Note (Signed)
Today's Vitals   05/22/20 1516 05/22/20 1522  BP: 122/88 107/72  Pulse: 94 86  Temp: 98.5 F (36.9 C)   TempSrc: Oral   SpO2: 95%   Weight: 213 lb (96.6 kg)   Height: 5\' 5"  (1.651 m)    Body mass index is 35.45 kg/m.  Patient with HTN, well-controlled with losartan 100mg , metoprolol 100mg , and norvasc 5mg . BP 107/72 today. Patient does report times where she will feel somewhat lightheaded and dizzy and thinks it may be due to very tight BP control. Will remove metoprolol 100mg  from antihypertensive medication list and assess BP on losartan and norvasc.  Advised to take BP measurements at home over the next couple of weeks to ensure appropriate BP control (goal <140/90). If patient's HTN becomes uncontrolled, consider increasing norvasc to 10mg  daily.  Plan: -stop metoprolol -continue losartan and norvasc -goal <140/90 -if HTN becomes uncontrolled, increase norvasc to 10mg  daily

## 2020-05-22 NOTE — Patient Instructions (Signed)
Brenda Powell,  It was a pleasure seeing you in the clinic today.   We discussed the following today:  1. Please stop taking your home metoprolol. Also please take your blood pressure at home a couple of times a day and call us if it is staying high (higher than 140/80).  2. I have placed referrals for a screening mammogram and to a OBGYN doctor for you to establish care.  3. You received your pneumococcal vaccine (PPSV-23) today.  4. Please come back in 3 months so that we can recheck your blood pressure and obtain your annual labs.  Please call our clinic at [phone] if you have any questions or concerns. The best time to call is Monday-Friday from 9am-4pm, but there is someone available 24/7 at the same number. If you need medication refills, please notify your pharmacy one week in advance and they will send [place] a request.   Thank you for letting us take part in your care. We look forward to seeing you next time!

## 2020-05-22 NOTE — Progress Notes (Signed)
   CC: wellness visit and need for referral for screening mammogram  HPI:  [name] is a 70 y.o. female with history listed below presenting to the [facility] for routine wellness visit and need for referral for mammogram. Please see individualized problem based charting for full HPI.  Past Medical History:  Diagnosis Date  . B12 deficiency   . Dry eye   . HTN (hypertension)   . Hyperlipidemia   . Insomnia   . MDD (major depressive disorder)     Review of Systems:  Negative aside from that listed in individualized problem based charting.  Physical Exam:  Vitals:   05/22/20 1516  BP: 122/88  Pulse: 94  SpO2: 95%  Weight: 213 lb (96.6 kg)   Physical Exam Constitutional:      Appearance: She is obese. She is not ill-appearing.  HENT:     Head: Normocephalic and atraumatic.     Nose: Nose normal. No congestion.     Mouth/Throat:     Mouth: Mucous membranes are moist.     Pharynx: Oropharynx is clear. No oropharyngeal exudate.  Eyes:     Extraocular Movements: Extraocular movements intact.     Conjunctiva/sclera: Conjunctivae normal.     Pupils: Pupils are equal, round, and reactive to light.  Cardiovascular:     Rate and Rhythm: Normal rate and regular rhythm.     Pulses: Normal pulses.     Heart sounds: Normal heart sounds. No murmur heard. No friction rub. No gallop.   Pulmonary:     Effort: Pulmonary effort is normal.     Breath sounds: Normal breath sounds. No wheezing, rhonchi or rales.  Abdominal:     General: Bowel sounds are normal. There is no distension.     Palpations: Abdomen is soft.     Tenderness: There is no abdominal tenderness.  Musculoskeletal:        General: No swelling. Normal range of motion.     Cervical back: Normal range of motion.  Skin:    General: Skin is warm and dry.  Neurological:     General: No focal deficit present.     Mental Status: She is alert and oriented to person, place, and time.  Psychiatric:        Mood and  Affect: Mood normal.        Behavior: Behavior normal.      Assessment & Plan:   See Encounters Tab for problem based charting.  Patient discussed with Dr. Heide Spark

## 2020-05-22 NOTE — Assessment & Plan Note (Signed)
Patient was on temazepam for insomnia in the past. However, she reports that the last time she took it was about 3 months ago as her sleep has been much better as of late. Therefore, will discontinue temazepam from medication list.

## 2020-05-22 NOTE — Assessment & Plan Note (Signed)
Placed referral for screening mammogram and referral to establish care with OBGYN.  Received PPSV-23 vaccine today.  Received COVID booster with [employer] on 03/02/2020

## 2020-05-25 NOTE — Progress Notes (Signed)
Internal Medicine Clinic Attending  Case discussed with Dr. Jinwala  At the time of the visit.  We reviewed the resident's history and exam and pertinent patient test results.  I agree with the assessment, diagnosis, and plan of care documented in the resident's note.  

## 2020-06-08 ENCOUNTER — Encounter: Payer: Self-pay | Admitting: *Deleted

## 2020-06-28 ENCOUNTER — Other Ambulatory Visit: Payer: Self-pay | Admitting: Nurse Practitioner

## 2020-06-29 ENCOUNTER — Ambulatory Visit (INDEPENDENT_AMBULATORY_CARE_PROVIDER_SITE_OTHER): Payer: Medicare Other | Admitting: Nurse Practitioner

## 2020-06-29 ENCOUNTER — Encounter: Payer: Self-pay | Admitting: Nurse Practitioner

## 2020-06-29 ENCOUNTER — Other Ambulatory Visit: Payer: Self-pay

## 2020-06-29 ENCOUNTER — Other Ambulatory Visit (HOSPITAL_COMMUNITY)
Admission: RE | Admit: 2020-06-29 | Discharge: 2020-06-29 | Disposition: A | Discharge status: Home / Self Care | Payer: Medicare Other | Source: Ambulatory Visit | Attending: Nurse Practitioner | Admitting: Nurse Practitioner

## 2020-06-29 ENCOUNTER — Ambulatory Visit
Admission: RE | Admit: 2020-06-29 | Discharge: 2020-06-29 | Disposition: A | Discharge status: Home / Self Care | Payer: Medicare Other | Source: Ambulatory Visit | Attending: Internal Medicine | Admitting: Internal Medicine

## 2020-06-29 VITALS — BP 112/86 | HR 120 | Ht 64.0 in | Wt 214.3 lb

## 2020-06-29 DIAGNOSIS — Z1231 Encounter for screening mammogram for malignant neoplasm of breast: Secondary | ICD-10-CM | POA: Diagnosis not present

## 2020-06-29 DIAGNOSIS — R945 Abnormal results of liver function studies: Secondary | ICD-10-CM | POA: Insufficient documentation

## 2020-06-29 DIAGNOSIS — Z01419 Encounter for gynecological examination (general) (routine) without abnormal findings: Secondary | ICD-10-CM | POA: Diagnosis not present

## 2020-06-29 DIAGNOSIS — Z1151 Encounter for screening for human papillomavirus (HPV): Secondary | ICD-10-CM | POA: Insufficient documentation

## 2020-06-29 DIAGNOSIS — E669 Obesity, unspecified: Secondary | ICD-10-CM | POA: Insufficient documentation

## 2020-06-29 DIAGNOSIS — D7589 Other specified diseases of blood and blood-forming organs: Secondary | ICD-10-CM | POA: Insufficient documentation

## 2020-06-29 DIAGNOSIS — G8929 Other chronic pain: Secondary | ICD-10-CM | POA: Insufficient documentation

## 2020-06-29 NOTE — Progress Notes (Signed)
GYNECOLOGY ANNUAL PREVENTATIVE CARE ENCOUNTER NOTE  Subjective:   Brenda Powell is a 70 y.o. [phone] female here for a routine annual gynecologic exam.  She sees internal medicine for her other health care needs but wanted to get updated for her GYN exam.  Has mammogram ordered but has not yet scheduled a mammogram.  Does not remember when she had a pap smear last.  Reviewed guidelines for stopping paps at age 57.  Wants a pap today and if normal, then no future paps are needed.  Current complaints:none.   Denies abnormal vaginal bleeding, discharge, pelvic pain, problems with intercourse or other gynecologic concerns.    Gynecologic History No LMP recorded. Patient is postmenopausal. Last Pap: unknown.  Last mammogram: 04-2018. Results were: normal  Obstetric History OB History  Gravida Para Term Preterm AB Living  3 3 3     2   SAB IAB Ectopic Multiple Live Births          2    # Outcome Date GA Lbr Len/2nd Weight Sex Delivery Anes PTL Lv  3 Term 1987     Vag-Spont   LIV  2 Term 62        FD  1 Term 25     Vag-Spont   LIV    Past Medical History:  Diagnosis Date  . B12 deficiency   . Dry eye   . HTN (hypertension)   . Hyperlipidemia   . Insomnia   . MDD (major depressive disorder)     Past Surgical History:  Procedure Laterality Date  . BREAST BIOPSY Left   . HERNIA REPAIR  10/2017  . 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 TOTAL KNEE ARTHROPLASTY Left 07/2007    Current Outpatient Medications on File Prior to Visit  Medication Sig Dispense Refill  . amLODipine (NORVASC) 5 MG tablet TAKE 1 TABLET(5 MG) BY MOUTH DAILY 90 tablet 1  . atorvastatin (LIPITOR) 80 MG tablet TAKE 1 TABLET(80 MG) BY MOUTH DAILY 90 tablet 1  . cycloSPORINE (RESTASIS) 0.05 % ophthalmic emulsion 1 drop 2 (two) times daily.    08/2007 FLUoxetine (PROZAC) 40 MG capsule Take 1 capsule (40 mg total) by mouth daily. 30 capsule 2  . fluticasone (FLONASE) 50 MCG/ACT nasal spray 1 spray in each  nostril    . losartan (COZAAR) 100 MG tablet TAKE 1 TABLET(100 MG) BY MOUTH DAILY 90 tablet 1  . vitamin B-12 (CYANOCOBALAMIN) 1000 MCG tablet TAKE 1 TABLET(1000 MCG) BY MOUTH DAILY 90 tablet 0  . metoprolol succinate (TOPROL-XL) 100 MG 24 hr tablet 1 tablet (Patient not taking: Reported on 06/29/2020)    . temazepam (RESTORIL) 15 MG capsule 1 capsule at bedtime as needed (Patient not taking: Reported on 06/29/2020)     No current facility-administered medications on file prior to visit.    No Known Allergies  Social History   Socioeconomic History  . Marital status: Married    Spouse name: Not on file  . Number of children: Not on file  . Years of education: Not on file  . Highest education level: Not on file  Occupational History  . Not on file  Tobacco Use  . Smoking status: Never Smoker  . Smokeless tobacco: Never Used  Substance and Sexual Activity  . Alcohol use: Yes    Alcohol/week: 7.0 standard drinks    Types: 7 Glasses of wine per week  . Drug use: Never  . Sexual activity: Yes    Partners: Male  Other Topics Concern  . Not on file  Social History Narrative  . Not on file   Social Determinants of Health   Financial Resource Strain: Not on file  Food Insecurity: Not on file  Transportation Needs: Not on file  Physical Activity: Not on file  Stress: Not on file  Social Connections: Not on file  Intimate Partner Violence: Not on file    Family History  Problem Relation Age of Onset  . Congestive Heart Failure Father     The following portions of the patient's history were reviewed and updated as appropriate: allergies, current medications, past family history, past medical history, past social history, past surgical history and problem list.  Review of Systems Pertinent items noted in HPI and remainder of comprehensive ROS otherwise negative.   Objective:  BP 112/86   Pulse (!) 120   Ht 5\' 4"  (1.626 m)   Wt 214 lb 4.8 oz (97.2 kg)   BMI 36.78 kg/m   CONSTITUTIONAL: Well-developed, well-nourished female in no acute distress.  HENT:  Normocephalic, atraumatic, External right and left ear normal.  EYES: Conjunctivae and EOM are normal. Pupils are equal, round.  No scleral icterus.  NECK: Normal range of motion, supple, no masses.  Normal thyroid.  SKIN: Skin is warm and dry. No rash noted. Not diaphoretic. No erythema. No pallor. NEUROLOGIC: Alert and oriented to person, place, and time. Normal reflexes, muscle tone coordination. No cranial nerve deficit noted. Some shaking and possible tremors noted, but client is quite nervous at her exam today. PSYCHIATRIC: Normal mood and affect. Normal behavior. Normal judgment and thought content. CARDIOVASCULAR: Normal heart rate noted, regular rhythm RESPIRATORY: Clear to auscultation bilaterally. Effort and breath sounds normal, no problems with respiration noted. BREASTS: Large and pendulous. Symmetric in size. No masses, skin changes, nipple drainage, or lymphadenopathy. ABDOMEN: Soft, no distention noted.  No tenderness, rebound or guarding.  PELVIC: Normal appearing external genitalia; slightly red vaginal mucosa. Cervix normal.  No abnormal discharge noted.  Pap smear obtained.   Exam limited due to habitus.  No palpable masses, no uterine or adnexal tenderness. MUSCULOSKELETAL: Normal range of motion. No tenderness.  No cyanosis, clubbing, or edema.    Assessment and Plan:  Annual GYN exam.   Referred from Internal Medicine - several chronic conditions followed by her PCP and not addressed today. Nonsmoker Post menopausal. Reviewed 30 minutes of walking 5 x week for exercise.  This will be new for her but willing to incorporate in her day. Denies any pain with intercourse and no problems with vaginal discharge   Will follow up results of pap smear and manage accordingly. Mammogram scheduled Routine preventative health maintenance measures emphasized. Please refer to After Visit Summary  for other counseling recommendations.    , RN, MSN, NP-BC Nurse Practitioner, [facility]

## 2020-06-30 LAB — CYTOLOGY - PAP
Comment: NEGATIVE
Diagnosis: NEGATIVE
High risk HPV: NEGATIVE

## 2020-08-10 ENCOUNTER — Emergency Department (HOSPITAL_BASED_OUTPATIENT_CLINIC_OR_DEPARTMENT_OTHER)
Admission: EM | Admit: 2020-08-10 | Discharge: 2020-08-10 | Disposition: A | Discharge status: Home / Self Care | Payer: Medicare Other | Attending: Emergency Medicine | Admitting: Emergency Medicine

## 2020-08-10 ENCOUNTER — Other Ambulatory Visit: Payer: Self-pay

## 2020-08-10 ENCOUNTER — Emergency Department (HOSPITAL_BASED_OUTPATIENT_CLINIC_OR_DEPARTMENT_OTHER): Payer: Medicare Other

## 2020-08-10 ENCOUNTER — Encounter (HOSPITAL_BASED_OUTPATIENT_CLINIC_OR_DEPARTMENT_OTHER): Payer: Self-pay

## 2020-08-10 DIAGNOSIS — S0083XA Contusion of other part of head, initial encounter: Secondary | ICD-10-CM | POA: Diagnosis not present

## 2020-08-10 DIAGNOSIS — R000 Tachycardia, unspecified: Secondary | ICD-10-CM | POA: Insufficient documentation

## 2020-08-10 DIAGNOSIS — R0682 Tachypnea, not elsewhere classified: Secondary | ICD-10-CM | POA: Insufficient documentation

## 2020-08-10 DIAGNOSIS — W01198A Fall on same level from slipping, tripping and stumbling with subsequent striking against other object, initial encounter: Secondary | ICD-10-CM | POA: Diagnosis not present

## 2020-08-10 DIAGNOSIS — I10 Essential (primary) hypertension: Secondary | ICD-10-CM | POA: Insufficient documentation

## 2020-08-10 DIAGNOSIS — W19XXXA Unspecified fall, initial encounter: Secondary | ICD-10-CM

## 2020-08-10 DIAGNOSIS — S0990XA Unspecified injury of head, initial encounter: Secondary | ICD-10-CM | POA: Diagnosis present

## 2020-08-10 DIAGNOSIS — S0003XA Contusion of scalp, initial encounter: Secondary | ICD-10-CM | POA: Diagnosis not present

## 2020-08-10 DIAGNOSIS — Z79899 Other long term (current) drug therapy: Secondary | ICD-10-CM | POA: Insufficient documentation

## 2020-08-10 NOTE — ED Provider Notes (Signed)
[facility] EMERGENCY DEPARTMENT Provider Note   CSN: [id number] Arrival date & time: 08/10/20  1225     History Chief Complaint  Patient presents with  . Fall    Brenda Powell is a 70 y.o. female.  The history is provided by the patient.  Fall This is a new problem. Episode onset: 4 days ago. The problem occurs constantly. Progression since onset: was leaning over to help her husband up and she fell forward and hit her left forehead on the nightstand.  swelling moved to her left upper eyelid this morning. Associated symptoms comments: No eye pain or vision changes but eyelid is so swollen she can't see out of it and is messing up her equilibrium.  No new falls.  No anticoagulation.  No neck pain. Pt does report being nervous and a little out of breath.       Past Medical History:  Diagnosis Date  . B12 deficiency   . Dry eye   . HTN (hypertension)   . Hyperlipidemia   . Insomnia   . MDD (major depressive disorder)     Patient Active Problem List   Diagnosis Date Noted  . Other specified diseases of blood and blood-forming organs 06/29/2020  . Obesity 06/29/2020  . Chronic pain 06/29/2020  . Abnormal liver function 06/29/2020  . Healthcare maintenance 05/22/2020  . Diverticulosis of sigmoid colon 09/13/2019  . Gastroesophageal reflux disease without esophagitis 09/13/2019  . Internal hemorrhoids 09/13/2019  . Essential hypertension 07/06/2019  . Insomnia 07/06/2019  . Reactive depression 07/06/2019  . Elevated triglycerides with high cholesterol 07/06/2019  . Vitamin B12 deficiency (non anemic) 07/06/2019    Past Surgical History:  Procedure Laterality Date  . BREAST BIOPSY Left   . HERNIA REPAIR  10/2017  . 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 TOTAL KNEE ARTHROPLASTY Left 07/2007     OB History    Gravida  3   Para  3   Term  3   Preterm      AB      Living  2     SAB      IAB      Ectopic      Multiple      Live Births   2           Family History  Problem Relation Age of Onset  . Congestive Heart Failure Father     Social History   Tobacco Use  . Smoking status: Never Smoker  . Smokeless tobacco: Never Used  Substance Use Topics  . Alcohol use: Yes    Alcohol/week: 7.0 standard drinks    Types: 7 Glasses of wine per week    Comment: Daily  . Drug use: Never    Home Medications Prior to Admission medications   Medication Sig Start Date End Date Taking? Authorizing Provider  amLODipine (NORVASC) 5 MG tablet TAKE 1 TABLET(5 MG) BY MOUTH DAILY 04/19/20  Yes Dellia Cloud, MD  atorvastatin (LIPITOR) 80 MG tablet TAKE 1 TABLET(80 MG) BY MOUTH DAILY 04/19/20  Yes Dellia Cloud, MD  cycloSPORINE (RESTASIS) 0.05 % ophthalmic emulsion 1 drop 2 (two) times daily.   Yes [provider]  fluticasone (FLONASE) 50 MCG/ACT nasal spray 1 spray in each nostril 01/27/19  Yes [provider]  losartan (COZAAR) 100 MG tablet TAKE 1 TABLET(100 MG) BY MOUTH DAILY 04/19/20  Yes Dellia Cloud, MD  vitamin B-12 (CYANOCOBALAMIN) 1000 MCG tablet TAKE 1 TABLET(1000 MCG) BY MOUTH DAILY  04/19/20  Yes Dellia Cloud, MD  FLUoxetine (PROZAC) 40 MG capsule Take 1 capsule (40 mg total) by mouth daily. 04/19/20 07/18/20  Dellia Cloud, MD  metoprolol succinate (TOPROL-XL) 100 MG 24 hr tablet 1 tablet Patient not taking: Reported on 06/29/2020    [provider]  temazepam (RESTORIL) 15 MG capsule 1 capsule at bedtime as needed Patient not taking: Reported on 06/29/2020    [provider]    Allergies    Patient has no known allergies.  Review of Systems   Review of Systems  All other systems reviewed and are negative.   Physical Exam Updated Vital Signs BP (!) 150/99 (BP Location: Right Arm)   Pulse (!) 127   Temp 98.2 F (36.8 C) (Oral)   Resp (!) 24   Ht 5\' 4"  (1.626 m)   Wt 90.7 kg   SpO2 100%   BMI 34.33 kg/m   Physical Exam Vitals and nursing note reviewed.   Constitutional:      General: She is not in acute distress.    Appearance: She is well-developed.     Comments: anxious  HENT:     Head: Normocephalic.      Right Ear: Tympanic membrane normal.     Left Ear: Tympanic membrane normal.  Eyes:     Extraocular Movements: Extraocular movements intact.     Conjunctiva/sclera:     Left eye: Left conjunctiva is not injected. No exudate or hemorrhage.    Pupils: Pupils are equal, round, and reactive to light.     Comments: Left eye with periorbital ecchymosis and upper lid swelling  Cardiovascular:     Rate and Rhythm: Regular rhythm. Tachycardia present.     Heart sounds: Normal heart sounds. No murmur heard. No friction rub.  Pulmonary:     Effort: Pulmonary effort is normal. Tachypnea present.     Breath sounds: Normal breath sounds. No wheezing or rales.  Abdominal:     General: Bowel sounds are normal. There is no distension.     Palpations: Abdomen is soft.     Tenderness: There is no abdominal tenderness. There is no guarding or rebound.  Musculoskeletal:        General: No tenderness. Normal range of motion.     Cervical back: Normal range of motion and neck supple. No tenderness.     Comments: No edema  Skin:    General: Skin is warm and dry.     Findings: No rash.  Neurological:     Mental Status: She is alert and oriented to person, place, and time.     Cranial Nerves: No cranial nerve deficit.  Psychiatric:        Mood and Affect: Mood is anxious.        Behavior: Behavior normal.     ED Results / Procedures / Treatments   Labs (all labs ordered are listed, but only abnormal results are displayed) Labs Reviewed - No data to display  EKG None  Radiology CT Head Wo Contrast  Result Date: 08/10/2020 CLINICAL DATA:  Patient fell and hit forehead on bedside table. Left eye swollen and bruised. EXAM: CT HEAD WITHOUT CONTRAST CT MAXILLOFACIAL WITHOUT CONTRAST TECHNIQUE: Multidetector CT imaging of the head and  maxillofacial structures were performed using the standard protocol without intravenous contrast. Multiplanar CT image reconstructions of the maxillofacial structures were also generated. COMPARISON:  None. FINDINGS: CT HEAD FINDINGS Brain: There is no evidence for acute hemorrhage, hydrocephalus, mass lesion, or abnormal extra-axial  fluid collection. No definite CT evidence for acute infarction. Vascular: No hyperdense vessel or unexpected calcification. Skull: No evidence for fracture. No worrisome lytic or sclerotic lesion. Other: Left frontal scalp contusion evident. CT MAXILLOFACIAL FINDINGS Osseous: No fracture or mandibular dislocation. No destructive process. Orbits: No acute bony abnormality. Globes are symmetric in size and shape. Prominent soft tissue swelling noted over the left orbital region. Sinuses: Scattered mucosal thickening noted in ethmoid air cells and right sphenoid sinus. Soft tissues: Swelling over the left orbital region. IMPRESSION: 1. No acute intracranial abnormality. 2. Left frontal scalp contusion with prominent soft tissue swelling over the left orbital region. 3. No evidence for maxillofacial fracture. Electronically Signed   By: Kennith Center M.D.   On: 08/10/2020 13:28   CT Maxillofacial Wo Contrast  Result Date: 08/10/2020 CLINICAL DATA:  Patient fell and hit forehead on bedside table. Left eye swollen and bruised. EXAM: CT HEAD WITHOUT CONTRAST CT MAXILLOFACIAL WITHOUT CONTRAST TECHNIQUE: Multidetector CT imaging of the head and maxillofacial structures were performed using the standard protocol without intravenous contrast. Multiplanar CT image reconstructions of the maxillofacial structures were also generated. COMPARISON:  None. FINDINGS: CT HEAD FINDINGS Brain: There is no evidence for acute hemorrhage, hydrocephalus, mass lesion, or abnormal extra-axial fluid collection. No definite CT evidence for acute infarction. Vascular: No hyperdense vessel or unexpected  calcification. Skull: No evidence for fracture. No worrisome lytic or sclerotic lesion. Other: Left frontal scalp contusion evident. CT MAXILLOFACIAL FINDINGS Osseous: No fracture or mandibular dislocation. No destructive process. Orbits: No acute bony abnormality. Globes are symmetric in size and shape. Prominent soft tissue swelling noted over the left orbital region. Sinuses: Scattered mucosal thickening noted in ethmoid air cells and right sphenoid sinus. Soft tissues: Swelling over the left orbital region. IMPRESSION: 1. No acute intracranial abnormality. 2. Left frontal scalp contusion with prominent soft tissue swelling over the left orbital region. 3. No evidence for maxillofacial fracture. Electronically Signed   By: Kennith Center M.D.   On: 08/10/2020 13:28    Procedures Procedures   Medications Ordered in ED Medications - No data to display  ED Course  I have reviewed the triage vital signs and the nursing notes.  Pertinent labs & imaging results that were available during my care of the patient were reviewed by me and considered in my medical decision making (see chart for details).    MDM Rules/Calculators/A&P                          Patient presenting today due to significant bruising and swelling in her left eyelid and around her eye.  Patient fell 4 days ago and had significant hematoma present in the left forehead.  She denied loss of consciousness with the fall and does not take anticoagulation.  However when she woke up this morning her left eye was swollen shut.  There is significant ecchymosis but when eyelid lifted her vision is normal.  Pupil appears normal with normal reactivity, extraocular movements.  Patient denies any further injury.  Her vision is intact.  Will image to ensure no acute intracranial or facial abnormalities.  Patient is extremely tachycardic here at 130 with some mild tachypnea.  She reports feeling anxious.  We will continue to monitor but if heart rate  remains elevated patient will need an EKG.  1:57 PM Imaging is normal except for hematoma.  On re-evaluation pt is calmer and HR has improved.  She denies SOB, chest  pain, dizziness or palpitations but does report that she suffers from a lot of anxiety and recommended f/u with pcp about treatment.  MDM Number of Diagnoses or Management Options   Amount and/or Complexity of Data Reviewed Tests in the radiology section of CPT: ordered and reviewed Independent visualization of images, tracings, or specimens: yes    Final Clinical Impression(s) / ED Diagnoses Final diagnoses:  Fall, initial encounter  Traumatic hematoma of forehead, initial encounter    Rx / DC Orders ED Discharge Orders    None       Gwyneth SproutPlunkett, Reise Gladney, MD 08/10/20 1402

## 2020-08-10 NOTE — Discharge Instructions (Signed)
Sleep with your head elevated to help with swelling and bruising.

## 2020-08-10 NOTE — ED Triage Notes (Signed)
Pt had a fall on Sunday night. Hit left forehead on bedside table. Left eye swollen and bruised. Bruising noted to forehead. States has had infection to left eye previously. Denies vision changes. Not on blood thinners.

## 2020-08-15 ENCOUNTER — Encounter: Payer: Medicare Other | Admitting: Internal Medicine

## 2020-08-23 ENCOUNTER — Ambulatory Visit (INDEPENDENT_AMBULATORY_CARE_PROVIDER_SITE_OTHER): Payer: Medicare Other | Admitting: Internal Medicine

## 2020-08-23 ENCOUNTER — Encounter: Payer: Self-pay | Admitting: Internal Medicine

## 2020-08-23 VITALS — BP 117/80 | HR 104 | Temp 98.1°F | Ht 64.0 in | Wt 210.5 lb

## 2020-08-23 DIAGNOSIS — F410 Panic disorder [episodic paroxysmal anxiety] without agoraphobia: Secondary | ICD-10-CM | POA: Diagnosis not present

## 2020-08-23 DIAGNOSIS — I10 Essential (primary) hypertension: Secondary | ICD-10-CM | POA: Diagnosis not present

## 2020-08-23 DIAGNOSIS — E538 Deficiency of other specified B group vitamins: Secondary | ICD-10-CM | POA: Diagnosis not present

## 2020-08-23 DIAGNOSIS — E782 Mixed hyperlipidemia: Secondary | ICD-10-CM | POA: Diagnosis not present

## 2020-08-23 MED ORDER — FLUOXETINE HCL 40 MG PO CAPS: 40.0000 mg | ORAL_CAPSULE | Freq: Every day | ORAL | 2 refills | Status: DC

## 2020-08-23 NOTE — Patient Instructions (Addendum)
Ms. Brenda Powell,  It was a pleasure to see you today. Thank you for coming in.   Today we discussed your blood pressure. In regards to this please continue your current medications. Your blood pressure looks good today.   We also discussed your anxiety issues. I am sorry that you are having this. I have refilled the fluoxetine medication, please let us know if you were taking this medication and we may switch it to something else. Also try to work on relaxation techniques to help with your anxiety.   Please return to clinic in 3 months or sooner if needed.   Thank you again for coming in.   Claudean Severance.D.

## 2020-08-23 NOTE — Assessment & Plan Note (Addendum)
Patient states that for about for 1 year she has been having anxiety episodes, where she gets sweaty, anxious, has palpitations and gets shaky. She states it lasts for about 10-15 minutes and then it resolves with no intervention. She will go and sit down and try to calm herself down. Occurs about 1-2 times per month. Denies any chest pain, SOB, nausea, vomiting, headaches, fevers, chills, abdominal pain, or other symptoms.  Had a recent episode at [facility] where she was checking out and it was taking awhile, she got very anxious and then she started having shakiness and becoming sweaty. It always is related to stress, like when she thinks she won't get somewhere on time, or when she thinks she may miss a deadline. Never occurs when she's resting or related to exercise.  Has a history of panic attacks a few years ago, she went to the hospital, her heart rate went up to 150, she was evaluated and was told that this was likely a panic attack. Thinks it was related to work stress at that time. She denies any depression symptoms, she is able to enjoy things and her sleep is doing okay. Her GAD-7 today is 0.  PHQ-9 is also 0.  Symptoms are not concerning for anxiety or major depressive disorder however given the sudden onset of symptoms in interference with her life this could be consistent with panic disorder.  Patient had been on fluoxetine in the past for depression, she does not think she is taking this, however states she is taking 5 medications and is not completely sure she is not taking it, and is not sure why this was stopped.  We discussed relaxation techniques and that we can start her back on the fluoxetine.  -Restart Prozac 40 mg daily, advised patient to contact us if she is already taking this medication, if so we can switch to another SSRI -Check TSH  Addendum: Patient that she was taking the Prozac.  We will switch her to sertraline 25 mg x 1 week then increase to 50 mg daily.  TSH WNL.  Informed  patient.

## 2020-08-23 NOTE — Assessment & Plan Note (Signed)
Currently on Lipitor 80 mg daily.  Last lipid panel 1 year ago showed LDL of 86, cholesterol 156, HDL 45.  -Continue Lipitor 80 mg daily

## 2020-08-23 NOTE — Assessment & Plan Note (Signed)
Patient is currently on amlodipine 5 mg daily and losartan 100 mg daily.  She denies any issues taking her medications.   BP Readings from Last 3 Encounters:  08/23/20 117/80  08/10/20 136/85  06/29/20 112/86   Blood pressure today is well controlled.  -Continue losartan 100 mg daily -Continue amlodipine 5 mg daily

## 2020-08-23 NOTE — Assessment & Plan Note (Signed)
Patient has history of B12 deficiency and is on B12 supplements.  Last B12 11 months ago was around 1000.   -Continue B12 supplements

## 2020-08-23 NOTE — Progress Notes (Signed)
   CC: Hypertension, panic episodes  HPI:  [name] is a 70 y.o. with a history of hypertension, GERD, hyperlipidemia, and reactive depression who is coming in for follow-up of her hypertension and concern for panic episodes.    Past Medical History:  Diagnosis Date  . B12 deficiency   . Dry eye   . HTN (hypertension)   . Hyperlipidemia   . Insomnia   . MDD (major depressive disorder)    Review of Systems:   Constitutional: Negative for chills and fever.  Respiratory: Negative for shortness of breath.   Cardiovascular: Negative for chest pain and leg swelling.  Gastrointestinal: Negative for abdominal pain, nausea and vomiting.  Neurological: Negative for dizziness and headaches.   Physical Exam:  Vitals:   08/23/20 1051  BP: 117/80  Pulse: (!) 104  Temp: 98.1 F (36.7 C)  TempSrc: Oral  SpO2: 96%  Weight: 210 lb 8 oz (95.5 kg)  Height: 5\' 4"  (1.626 m)   Physical Exam HENT:     Head: Normocephalic and atraumatic.  Eyes:     Conjunctiva/sclera: Conjunctivae normal.     Pupils: Pupils are equal, round, and reactive to light.  Neck:     Thyroid: No thyromegaly.  Cardiovascular:     Rate and Rhythm: Normal rate and regular rhythm.     Heart sounds: Normal heart sounds. No murmur heard. No friction rub. No gallop.   Pulmonary:     Effort: Pulmonary effort is normal. No respiratory distress.     Breath sounds: Normal breath sounds. No wheezing.  Abdominal:     General: Bowel sounds are normal. There is no distension.     Palpations: Abdomen is soft.  Musculoskeletal:        General: Normal range of motion.     Cervical back: Normal range of motion and neck supple.  Skin:    General: Skin is warm and dry.     Findings: No erythema.  Neurological:     Mental Status: She is alert and oriented to person, place, and time.     Gait: Gait is intact.  Psychiatric:        Mood and Affect: Mood and affect normal.      Assessment & Plan:   See  Encounters Tab for problem based charting.  Patient discussed with Dr. 

## 2020-08-24 ENCOUNTER — Other Ambulatory Visit: Payer: Self-pay | Admitting: Internal Medicine

## 2020-08-24 ENCOUNTER — Telehealth: Payer: Self-pay

## 2020-08-24 LAB — TSH: TSH: 2.56 u[IU]/mL (ref 0.450–4.500)

## 2020-08-24 MED ORDER — SERTRALINE HCL 25 MG PO TABS: ORAL_TABLET | ORAL | 0 refills | Status: DC

## 2020-08-24 NOTE — Addendum Note (Signed)
Addended by: Claudean Severance on: 08/24/2020 01:07 PM   Modules accepted: Orders

## 2020-08-24 NOTE — Telephone Encounter (Signed)
Requesting to speak with a nurse about medication.  

## 2020-08-24 NOTE — Telephone Encounter (Signed)
Contacted patient and made changes to medication. Thank you.

## 2020-08-24 NOTE — Telephone Encounter (Signed)
Regarding: zoloft rx  Alternative Requested:INSURANCE COVERS MAX OF 1 TAB PER DAY. PLEASE SEND RX FOR 50MG  1/2 TAB X1 WEEK, THEN 1 TAB DAILY. THANKS, [facility] .

## 2020-08-24 NOTE — Telephone Encounter (Signed)
Pt states she was seen yesterday and wasnt sure if she had been taking fluoxetine.  Pt states she has been taking and was instructed to call back and let MD know.  She will not be picking up rx sent yesterday to pharmacy and states MD wanted to try something different.  Please advise.Kingsley Spittle Cassady5/26/202211:42 AM

## 2020-08-24 NOTE — Progress Notes (Signed)
Internal Medicine Clinic Attending ° °Case discussed with Dr. Krienke  At the time of the visit.  We reviewed the resident’s history and exam and pertinent patient test results.  I agree with the assessment, diagnosis, and plan of care documented in the resident’s note.  °

## 2020-09-17 ENCOUNTER — Other Ambulatory Visit: Payer: Self-pay | Admitting: Internal Medicine

## 2020-09-25 ENCOUNTER — Telehealth: Payer: Self-pay

## 2020-09-25 NOTE — Telephone Encounter (Signed)
Requesting to speak with a nurse about meds, please call pt back.  

## 2020-09-25 NOTE — Telephone Encounter (Signed)
Returned call to patient. States she used to take temazepam 7.5 mg but stopped last Fall. Would like to return to it as she is having trouble falling asleep. States she'll go to bed at 01-1129 PM and won't fall asleep till 3 AM; tosses and turns all night. Tele appt given with Red Team for 6/29 at 1315.

## 2020-09-27 ENCOUNTER — Ambulatory Visit (INDEPENDENT_AMBULATORY_CARE_PROVIDER_SITE_OTHER): Payer: Medicare Other | Admitting: Internal Medicine

## 2020-09-27 DIAGNOSIS — G4700 Insomnia, unspecified: Secondary | ICD-10-CM

## 2020-09-27 MED ORDER — TRAZODONE HCL 50 MG PO TABS: 50.0000 mg | ORAL_TABLET | Freq: Every day | ORAL | 0 refills | Status: DC

## 2020-09-27 NOTE — Assessment & Plan Note (Addendum)
Patient presents for telehealth visit for insomnia.  She states that she has been on Ambien in the past as well as melatonin.  She recently was taking temazepam 7.5 mg nightly, but has not been taking this medication for the past 3 months.  She states that she feels like she has increased her drinking in the interim to assist with falling asleep at night.  She states that it typically takes about 3 hours to fall asleep at night.  She does sleep in a dark room with blinds closed and keeps the room cool with a fan.  A/P: Patient presents for today telehealth for her insomnia.  She has tried multiple medications in the past including melatonin, Ambien, and recently temazepam which she weaned herself off.  She has noticed an increase in alcohol intake to 2 drinks nightly with an after dinner.  She does not have any withdrawal symptoms. Decreasing alcohol consumption could attribute to insomnia.  Discussed sleeping with Dr. Monna Fam for insomnia, however patient declined at this time.  Given her alcohol intake while taking temazepam and needed benzodiazepines should not be used at this time.  We will trial trazodone. - Trazodone 50 mg nightly

## 2020-09-27 NOTE — Progress Notes (Signed)
  [facility] Internal Medicine Residency Telephone Encounter Continuity Care Appointment  HPI:  This telephone encounter was created for Ms. Brenda Powell on 09/27/2020 for the following purpose/cc insomnia.   Past Medical History:  Past Medical History:  Diagnosis Date   B12 deficiency    Dry eye    HTN (hypertension)    Hyperlipidemia    Insomnia    MDD (major depressive disorder)      ROS:  Review of Systems  Constitutional:  Negative for chills, diaphoresis, fever, malaise/fatigue and weight loss.  Cardiovascular:  Negative for chest pain, palpitations and orthopnea.  Gastrointestinal:  Negative for abdominal pain, constipation, diarrhea, nausea and vomiting.  Neurological:  Negative for dizziness and headaches.  Psychiatric/Behavioral:  Negative for depression and hallucinations. The patient has insomnia. The patient is not nervous/anxious.      Assessment / Plan / Recommendations:  Please see A&P under problem oriented charting for assessment of the patient's acute and chronic medical conditions.  As always, pt is advised that if symptoms worsen or new symptoms arise, they should go to an urgent care facility or to to ER for further evaluation.   Consent and Medical Decision Making:  Patient discussed with Dr. Antony Contras This is a telephone encounter between Brenda Powell and Dolan Amen on 09/27/2020 for Insomnia. The visit was conducted with the patient located at home and Dolan Amen at [facility]. The patient's identity was confirmed using their DOB and current address. The patient has consented to being evaluated through a telephone encounter and understands the associated risks (an examination cannot be done and the patient may need to come in for an appointment) / benefits (allows the patient to remain at home, decreasing exposure to coronavirus). I personally spent 11 minutes on medical discussion.

## 2020-10-03 ENCOUNTER — Encounter: Payer: Self-pay | Admitting: *Deleted

## 2020-10-22 ENCOUNTER — Encounter: Payer: Self-pay | Admitting: *Deleted

## 2020-10-22 NOTE — Progress Notes (Unsigned)

## 2020-10-23 ENCOUNTER — Encounter: Payer: Self-pay | Admitting: Student

## 2020-10-23 NOTE — Progress Notes (Signed)
Things That May Be Affecting Your Health:  Alcohol  Hearing loss  Pain   X Depression  Home Safety  Sexual Health   Diabetes  Lack of physical activity  Stress   Difficulty with daily activities  Loneliness  Tiredness   Drug use  Medicines  Tobacco use   Falls  Motor Vehicle Safety X Weight  X Food choices  Oral Health  Other    YOUR PERSONALIZED HEALTH PLAN : 1. Schedule your next subsequent Medicare Wellness visit in one year 2. Attend all of your regular appointments to address your medical issues 3. Complete the preventative screenings and services   Annual Wellness Visit   Medicare Covered Preventative Screenings and Services  Services & Screenings Men and Women Who How Often Need? Date of Last Service Action  Abdominal Aortic Aneurysm Adults with AAA risk factors Once      Alcohol Misuse and Counseling All Adults Screening once a year if no alcohol misuse. Counseling up to 4 face to face sessions.     Bone Density Measurement  Adults at risk for osteoporosis Once every 2 yrs      Lipid Panel Z13.6 All adults without CV disease Once every 5 yrs       Colorectal Cancer  Stool sample or Colonoscopy All adults 50 and older  Once every year Every 10 years        Depression All Adults Once a year X Today   Diabetes Screening Blood glucose, post glucose load, or GTT Z13.1 All adults at risk Pre-diabetics Once per year Twice per year X     Diabetes  Self-Management Training All adults Diabetics 10 hrs first year; 2 hours subsequent years. Requires Copay     Glaucoma Diabetics Family history of glaucoma African Americans 50 yrs + Hispanic Americans 65 yrs + Annually - requires coppay      Hepatitis C Z72.89 or F19.20 High Risk for HCV Born between 1945 and 1965 Annually Once      HIV Z11.4 All adults based on risk Annually btw ages 2 & 12 regardless of risk Annually > 65 yrs if at increased risk      Lung Cancer Screening Asymptomatic adults aged 73-77 with 30  pack yr history and current smoker OR quit within the last 15 yrs Annually Must have counseling and shared decision making documentation before first screen      Medical Nutrition Therapy Adults with  Diabetes Renal disease Kidney transplant within past 3 yrs 3 hours first year; 2 hours subsequent years     Obesity and Counseling All adults Screening once a year Counseling if BMI 30 or higher X Today   Tobacco Use Counseling Adults who use tobacco  Up to 8 visits in one year     Vaccines Z23 Hepatitis B Influenza  Pneumonia  Adults  Once Once every flu season Two different vaccines separated by one year     Next Annual Wellness Visit People with Medicare Every year  Today     Services & Screenings Women Who How Often Need  Date of Last Service Action  Mammogram  Z12.31 Women over 40 One baseline ages 83-39. Annually ager 40 yrs+      Pap tests All women Annually if high risk. Every 2 yrs for normal risk women      Screening for cervical cancer with  Pap (Z01.419 nl or Z01.411abnl) & HPV Z11.51 Women aged 83 to 33 Once every 5 yrs  Screening pelvic and breast exams All women Annually if high risk. Every 2 yrs for normal risk women     Sexually Transmitted Diseases Chlamydia Gonorrhea Syphilis All at risk adults Annually for non pregnant females at increased risk         Services & Screenings Men Who How Ofter Need  Date of Last Service Action  Prostate Cancer - DRE & PSA Men over 50 Annually.  DRE might require a copay.        Sexually Transmitted Diseases Syphilis All at risk adults Annually for men at increased risk      Health Maintenance List Health Maintenance  Topic Date Due   Zoster Vaccines- Shingrix (1 of 2) Never done   COVID-19 Vaccine (4 - Booster for Moderna series) 05/31/2020   INFLUENZA VACCINE  10/30/2020   MAMMOGRAM  06/30/2022   TETANUS/TDAP  01/01/2023   COLONOSCOPY (Pts 45-17yrs Insurance coverage will need to be confirmed)  07/10/2027    DEXA SCAN  Completed   Hepatitis C Screening  Completed   PNA vac Low Risk Adult  Completed   HPV VACCINES  Aged Out

## 2020-10-24 ENCOUNTER — Other Ambulatory Visit: Payer: Self-pay | Admitting: Internal Medicine

## 2020-11-19 ENCOUNTER — Other Ambulatory Visit: Payer: Self-pay | Admitting: Internal Medicine

## 2020-11-19 DIAGNOSIS — E538 Deficiency of other specified B group vitamins: Secondary | ICD-10-CM

## 2020-11-22 ENCOUNTER — Other Ambulatory Visit: Payer: Self-pay | Admitting: Internal Medicine

## 2020-11-22 NOTE — Telephone Encounter (Signed)
It looks like the original prescription has 3x refills.

## 2020-11-23 NOTE — Telephone Encounter (Signed)
Original rx had no refills.  CMA added additional refills before sending request to pcp.

## 2021-01-25 DIAGNOSIS — Z23 Encounter for immunization: Secondary | ICD-10-CM | POA: Diagnosis not present

## 2021-02-15 ENCOUNTER — Encounter: Payer: Self-pay | Admitting: Student

## 2021-02-15 ENCOUNTER — Other Ambulatory Visit: Payer: Self-pay

## 2021-02-15 ENCOUNTER — Ambulatory Visit (INDEPENDENT_AMBULATORY_CARE_PROVIDER_SITE_OTHER): Payer: Medicare Other | Admitting: Student

## 2021-02-15 VITALS — BP 117/80 | HR 97 | Temp 97.9°F | Ht 64.0 in | Wt 209.1 lb

## 2021-02-15 DIAGNOSIS — I10 Essential (primary) hypertension: Secondary | ICD-10-CM | POA: Diagnosis not present

## 2021-02-15 DIAGNOSIS — F410 Panic disorder [episodic paroxysmal anxiety] without agoraphobia: Secondary | ICD-10-CM

## 2021-02-15 DIAGNOSIS — I951 Orthostatic hypotension: Secondary | ICD-10-CM

## 2021-02-15 MED ORDER — SERTRALINE HCL 100 MG PO TABS: 100.0000 mg | ORAL_TABLET | Freq: Every day | ORAL | 0 refills | Status: DC

## 2021-02-15 NOTE — Patient Instructions (Signed)
[name], it was a pleasure seeing you today!  Today we discussed: - Dizziness: I would like for you to drink plenty of fluids and hold your amlodipine. Please monitor your blood pressure over the next few weeks and follow-up in two weeks.  - Sertraline is very unlikely to cause the symptoms you are having. In order to improve your anxiety, I would like to increase the dose to 100mg  daily.  Follow-up:  2 weeks    Please make sure to arrive 15 minutes prior to your next appointment. If you arrive late, you may be asked to reschedule.   We look forward to seeing you next time. Please call our clinic at [phone] if you have any questions or concerns. The best time to call is Monday-Friday from 9am-4pm, but there is someone available 24/7. If after hours or the weekend, call the main hospital number and ask for the Internal Medicine Resident On-Call. If you need medication refills, please notify your pharmacy one week in advance and they will send [phone] a request.  Thank you for letting us take part in your care. Wishing you the best!  Thank you, [place], MD

## 2021-02-16 ENCOUNTER — Other Ambulatory Visit: Payer: Self-pay | Admitting: Student

## 2021-02-16 DIAGNOSIS — M720 Palmar fascial fibromatosis [Dupuytren]: Secondary | ICD-10-CM

## 2021-02-16 LAB — BMP8+ANION GAP
Anion Gap: 16 mmol/L (ref 10.0–18.0)
BUN/Creatinine Ratio: 16 (ref 12–28)
BUN: 13 mg/dL (ref 8–27)
CO2: 22 mmol/L (ref 20–29)
Calcium: 8.3 mg/dL — ABNORMAL LOW (ref 8.7–10.3)
Chloride: 102 mmol/L (ref 96–106)
Creatinine, Ser: 0.8 mg/dL (ref 0.57–1.00)
Glucose: 91 mg/dL (ref 70–99)
Potassium: 3.5 mmol/L (ref 3.5–5.2)
Sodium: 140 mmol/L (ref 134–144)
eGFR: 80 mL/min/{1.73_m2} (ref >59)

## 2021-02-18 DIAGNOSIS — I951 Orthostatic hypotension: Secondary | ICD-10-CM | POA: Insufficient documentation

## 2021-02-18 HISTORY — DX: Orthostatic hypotension: I95.1

## 2021-02-18 NOTE — Assessment & Plan Note (Signed)
BP Readings from Last 3 Encounters:  02/15/21 117/80  08/23/20 117/80  08/10/20 136/85   Blood pressure at goal today. Patient reports compliance with medication. Denies chest pain, dyspnea. Patient does report dizziness upon standing. Please see "Orthostatic hypotension" for more information. We will hold amlodipine and have patient return within the next 1-2 weeks.  - Hold amlodipine - Continue losartan 100mg  daily - Return in 1-2 weeks

## 2021-02-18 NOTE — Progress Notes (Signed)
   CC: dizziness, anxiety  HPI:  [name] is a 70 y.o. with hypertension, GERD, class II obesity presenting to [facility] to discuss recent dizziness and anxiety.  Please see problem-based list for further details.  Past Medical History:  Diagnosis Date   B12 deficiency    Dry eye    HTN (hypertension)    Hyperlipidemia    Insomnia    MDD (major depressive disorder)    Review of Systems:  As per HPI  Physical Exam:  Vitals:   02/15/21 1503  BP: 117/80  Pulse: 97  Temp: 97.9 F (36.6 C)  TempSrc: Oral  SpO2: 95%  Weight: 209 lb 1.6 oz (94.8 kg)  Height: 5\' 4"  (1.626 m)   General: Resting comfortably in no acute distress CV: Regular rate, rhythm. No murmurs appreciated. Distal pulses 2+ bilaterally. Pulm: Normal work of breathing on room air. Clear to auscultation bilaterally. MSK: Normal bulk, tone. No pitting edema bilateral lower extremities. Neuro: Awake, alert, conversing appropriately. No focal deficits. Psych: Normal mood, affect, speech.  Assessment & Plan:   See Encounters Tab for problem based charting.  Patient discussed with Dr.  

## 2021-02-18 NOTE — Assessment & Plan Note (Signed)
Patient has been taking Zoloft since May of this year. At that time patient reported episodes of anxiety that included palpitations and shakiness. Patient reports that she feels the medication has not helped much since her last visit. She continues to have some of these episodes periodically.   As noted per previous provider, does not appear patient has generalized anxiety disorder, but more consistent with panic disorder. TSH checked during may within normal limits. Discussed with patient that we will increase her SSRI. This will be patient's second SSRI. If symptoms are not controlled after trial of increased SSRI, can consider referral to psychiatry for further assistance.  - Increase sertraline 100mg  daily - Return in four weeks

## 2021-02-18 NOTE — Assessment & Plan Note (Addendum)
Patient is presenting today to discuss recent dizziness she has been experiencing recently. She describes feeling of the room spinning that lasts 10-15 seconds. Notes these episodes happen primarily upon standing and occur most days of the week. Denies any tinnitus, hearing loss, palpitations, dyspnea, syncopal episodes, falls, melena, hematochezia, dizziness with movement of head. Also denies recent infection, decreased po intake. Of note, she mentions these episodes are not correlated with trazodone, as she only has to take this every once in awhile.   On exam, no focal deficits appreciated. Low suspicion for posterior CVA. Low suspicion for bleeding as well. Given history of dizzines supon standing, orthostatic vital signs were obtained today. This revealed drop in systolic blood pressure of upon standing. Discussed with Brenda Powell that we will hold one of her antihypertensives for now and ensure she drinks plenty of fluids daily. Patient agreed to plan.  - Hold amlodipine - Continue losartan 100mg  daily - Return in 1-2 weeks to re-check blood pressure, orthostatics

## 2021-02-19 NOTE — Progress Notes (Signed)
Internal Medicine Clinic Attending ? ?Case discussed with Dr. Braswell  At the time of the visit.  We reviewed the resident?s history and exam and pertinent patient test results.  I agree with the assessment, diagnosis, and plan of care documented in the resident?s note.  ?

## 2021-02-20 ENCOUNTER — Encounter: Payer: Medicare Other | Admitting: Internal Medicine

## 2021-02-28 DIAGNOSIS — M720 Palmar fascial fibromatosis [Dupuytren]: Secondary | ICD-10-CM | POA: Diagnosis not present

## 2021-03-03 ENCOUNTER — Other Ambulatory Visit: Payer: Self-pay | Admitting: Internal Medicine

## 2021-03-03 DIAGNOSIS — E538 Deficiency of other specified B group vitamins: Secondary | ICD-10-CM

## 2021-03-14 ENCOUNTER — Encounter: Payer: Medicare Other | Admitting: Internal Medicine

## 2021-03-15 ENCOUNTER — Other Ambulatory Visit: Payer: Self-pay | Admitting: Student

## 2021-03-15 DIAGNOSIS — F410 Panic disorder [episodic paroxysmal anxiety] without agoraphobia: Secondary | ICD-10-CM

## 2021-03-18 NOTE — Progress Notes (Signed)
° °  CC: follow up  HPI:  Brenda Powell is a 70 y.o. with medical history as below presenting to [facility] for follow up.  Please see problem-based list for further details, assessments, and plans.  Past Medical History:  Diagnosis Date   B12 deficiency    Dry eye    HTN (hypertension)    Hyperlipidemia    Insomnia    MDD (major depressive disorder)    Review of Systems:  Review of system negative unless stated in the problem list or HPI.    Physical Exam:  There were no vitals filed for this visit.  Physical Exam General: Well-developed, NAD Head: Normocephalic without scalp lesions. Bruising on left side of face in the region.  Eyes: PERRLA, Conjunctivae pink, sclerae white, without icterus.  Mouth and Throat: Lips normal color, without lesions. Moist mucus membrane. Neck: Neck supple with full range of motion (ROM). No masses or tenderness.  Lungs: CTAB, no wheeze, rhonchi or rales.  Cardiovascular: Normal heart sounds, no r/m/g, 2+ pulses in all extremities. No LE edema Abdomen: No TTP, normal bowel sounds MSK: No asymmetry or muscle atrophy. Full range of motion (ROM) of all joints. No injuries noted. Strength 5/5 in all extremities.  Skin: warm, dry good skin turgor, bruise on left side of face in the maxillary region, non tender to palpation.  Neuro: Alert and oriented. CN II-XII intact. Finger to nose negative. Romberg negative.  Psych: Normal mood and normal affect   Assessment & Plan:   See Encounters Tab for problem based charting.  Patient seen with Dr.  Gwenevere Abbot, MD

## 2021-03-19 ENCOUNTER — Encounter: Payer: Self-pay | Admitting: Internal Medicine

## 2021-03-19 ENCOUNTER — Other Ambulatory Visit: Payer: Self-pay

## 2021-03-19 ENCOUNTER — Ambulatory Visit (INDEPENDENT_AMBULATORY_CARE_PROVIDER_SITE_OTHER): Payer: Medicare Other | Admitting: Internal Medicine

## 2021-03-19 VITALS — BP 148/93 | HR 91 | Temp 97.8°F | Ht 60.0 in | Wt 206.9 lb

## 2021-03-19 DIAGNOSIS — F410 Panic disorder [episodic paroxysmal anxiety] without agoraphobia: Secondary | ICD-10-CM

## 2021-03-19 DIAGNOSIS — F411 Generalized anxiety disorder: Secondary | ICD-10-CM

## 2021-03-19 DIAGNOSIS — I10 Essential (primary) hypertension: Secondary | ICD-10-CM | POA: Diagnosis not present

## 2021-03-19 DIAGNOSIS — Z9181 History of falling: Secondary | ICD-10-CM

## 2021-03-19 DIAGNOSIS — J310 Chronic rhinitis: Secondary | ICD-10-CM

## 2021-03-19 DIAGNOSIS — I951 Orthostatic hypotension: Secondary | ICD-10-CM | POA: Diagnosis not present

## 2021-03-19 DIAGNOSIS — G4700 Insomnia, unspecified: Secondary | ICD-10-CM | POA: Diagnosis not present

## 2021-03-19 MED ORDER — BUSPIRONE HCL 10 MG PO TABS: 10.0000 mg | ORAL_TABLET | Freq: Three times a day (TID) | ORAL | 0 refills | Status: DC | PRN

## 2021-03-19 MED ORDER — TRAZODONE HCL 100 MG PO TABS: 100.0000 mg | ORAL_TABLET | Freq: Every day | ORAL | 3 refills | Status: DC

## 2021-03-19 MED ORDER — FLUTICASONE PROPIONATE 50 MCG/ACT NA SUSP: NASAL | 0 refills | Status: DC

## 2021-03-19 NOTE — Patient Instructions (Addendum)
[name], it was a pleasure seeing you today! You endorsed feeling well today. Below are some of the things we talked about this visit. We look forward to seeing you in the follow up appointment!  Today we discussed: You presented for a follow up for dizziness. Your dizziness resolved after discontinuing the amlodipine. We recommend continuing Losartan 100 mg daily and keeping a log. You don't need to take amlodipine anymore.   We will refer you to Dr. Monna Fam for your anxiety and add buspirone 10 mg three times daily.   Please follow up in one month for blood with blood pressure log and for adjustment to your anxiety medicines.  I have ordered the following labs today:  Lab Orders  No laboratory test(s) ordered today      Referrals ordered today:   Referral Orders  No referral(s) requested today     I have ordered the following medication/changed the following medications:   Stop the following medications: Medications Discontinued During This Encounter  Medication Reason   traZODone (DESYREL) 50 MG tablet      Start the following medications: Meds ordered this encounter  Medications   traZODone (DESYREL) 100 MG tablet    Sig: Take 1 tablet (100 mg total) by mouth at bedtime.    Dispense:  90 tablet    Refill:  3   busPIRone (BUSPAR) 10 MG tablet    Sig: Take 1 tablet (10 mg total) by mouth 3 (three) times daily as needed.    Dispense:  90 tablet    Refill:  0     Follow-up: 1 month  Please make sure to arrive 15 minutes prior to your next appointment. If you arrive late, you may be asked to reschedule.   We look forward to seeing you next time. Please call our clinic at [phone] if you have any questions or concerns. The best time to call is Monday-Friday from 9am-4pm, but there is someone available 24/7. If after hours or the weekend, call the main hospital number and ask for the Internal Medicine Resident On-Call. If you need medication refills, please  notify your pharmacy one week in advance and they will send [place] a request.  Thank you for letting us take part in your care. Wishing you the best!  Thank you, Gwenevere Abbot, MD

## 2021-03-20 MED ORDER — SERTRALINE HCL 100 MG PO TABS: 100.0000 mg | ORAL_TABLET | Freq: Every day | ORAL | 3 refills | Status: DC

## 2021-03-21 DIAGNOSIS — Z9181 History of falling: Secondary | ICD-10-CM | POA: Insufficient documentation

## 2021-03-21 DIAGNOSIS — F411 Generalized anxiety disorder: Secondary | ICD-10-CM | POA: Insufficient documentation

## 2021-03-21 NOTE — Assessment & Plan Note (Signed)
Patient has difficulty falling asleep but denies any problem with staying asleep. Her regimen includes Trazadone 50 mg nightly. She endorsed poor sleep hygiene with sleeping at midnight and watching TV until that time. Advised moving her bed time earlier and turning the TV and electronics off at least one hour before her sleep. Limiting her caffeine intake. Overall, this is improved as she reports 1 hour sleep latency currently compared to 3 hours previously.   -Advised sleep hygiene -Trazadone 100 mg nightly

## 2021-03-21 NOTE — Assessment & Plan Note (Addendum)
Patient stated she didn't see noticeable improvement from the Sertraline yet. States she always feels on the edge and is constantly worrying. She gets panic attacks once a month. Her GAD-7 showed a score of 8. When asked if the answer to her questions would be similar few months ago, patient stated yes. She may have underlying GAD as well to her Panic Disorder. Advised giving Sertraline 100 mg more time to work, and increasing Trazodone to 100 qhs. Will add Buspirone 10 mg TID prn. She agreed to a referral to Dr. Monna Fam which may be beneficial to her. Will hold off on adding GAD to her problem list as patient had alcohol use that may contribute to her anxiety which she states she is no longer drinking after her fall.  -Continue Sertraline 100 mg, Trazodone 100 mg qhs  -Buspirone 10 mg TID prn qd -[facility] referral  -GAD 7 next visit

## 2021-03-21 NOTE — Assessment & Plan Note (Signed)
Patient had a recent fall that appears multifactorial. Had bruising on left side of the face in the maxillary region but no TTP. No lesions on head. No vision changes, no changes in sensation, speech, smell, taste reported by the patient. CN II-XII normal. Strength 5/5 in all extremities. Romberg test negative. Counseled on alcohol use and patient states she has been drinking more since she retired but does not plan on drinking after this event.  -Advised continuing conservative therapy -Counseled if this happens in the future to seek medical care  -Continue to monitor her alcohol intake

## 2021-03-21 NOTE — Assessment & Plan Note (Addendum)
Patient states this has resolved after discontinuation of Norvasc. Orthostatics negative today. -D/c Norvasc -Continue Losartan 100 mg qd -Continue to monitor

## 2021-03-21 NOTE — Assessment & Plan Note (Signed)
Assessment/Plan: Patient had orthostatic hypotension during the last visit and her Norvasc was held. She brought a bp log (media tab) showing majority of her BP <140/<90. Her BP in the clinic was elevated in clinic but given the readings on the bp log, and her low risk status we can continue her on Losartan 100 mg and discontinue Norvasc.  -Continue Losartan 100 mg

## 2021-03-28 NOTE — Progress Notes (Signed)
Internal Medicine Clinic Attending  I saw and evaluated the patient.  I personally confirmed the key portions of the history and exam documented by Dr. Khan and I reviewed pertinent patient test results.  The assessment, diagnosis, and plan were formulated together and I agree with the documentation in the resident's note.  

## 2021-04-08 ENCOUNTER — Other Ambulatory Visit: Payer: Self-pay | Admitting: Internal Medicine

## 2021-04-12 ENCOUNTER — Encounter: Payer: Self-pay | Admitting: Internal Medicine

## 2021-04-26 ENCOUNTER — Institutional Professional Consult (permissible substitution): Payer: Medicare Other | Admitting: Behavioral Health

## 2021-04-26 ENCOUNTER — Telehealth: Payer: Self-pay | Admitting: Behavioral Health

## 2021-04-26 NOTE — Telephone Encounter (Signed)
Unable to lv a msg for Pt today re: [facility] telehealth session for Intro/Assess.   Dr. Monna Fam

## 2021-05-05 ENCOUNTER — Other Ambulatory Visit: Payer: Self-pay | Admitting: Internal Medicine

## 2021-05-22 ENCOUNTER — Institutional Professional Consult (permissible substitution): Payer: Medicare Other | Admitting: Behavioral Health

## 2021-05-22 IMAGING — CT CT HEAD W/O CM
4 series · 16 of 47 positions shown, 18 images · non-contrast
Comparison: None.

CLINICAL DATA: Patient fell and hit forehead on bedside table. Left
eye swollen and bruised.

EXAM:
CT HEAD WITHOUT CONTRAST
CT MAXILLOFACIAL WITHOUT CONTRAST
TECHNIQUE: Multidetector CT imaging of the head and maxillofacial structures
were performed using the standard protocol without intravenous
contrast. Multiplanar CT image reconstructions of the maxillofacial
structures were also generated.

[Series 2: head wo · axial · 0.44mm/px · z∈[-150,-35]mm · 7 of 31 slices shown, 9 images]
[im 4/31  brain]
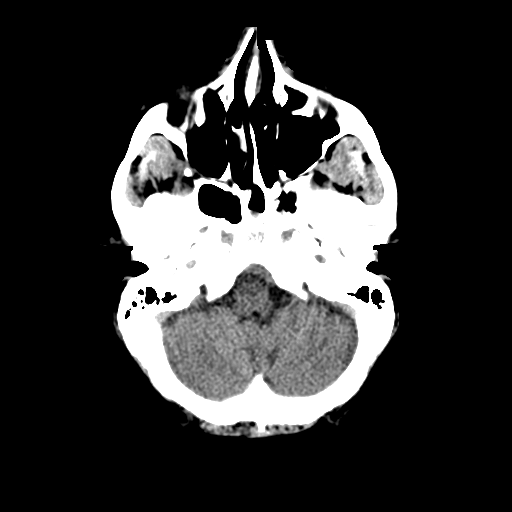
[im 4/31  bone]
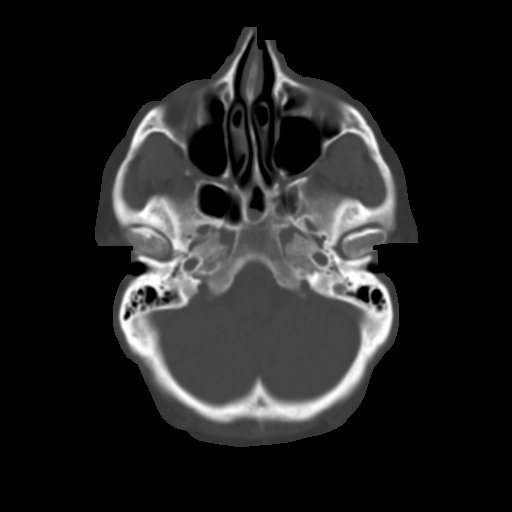
[im 8/31  brain]
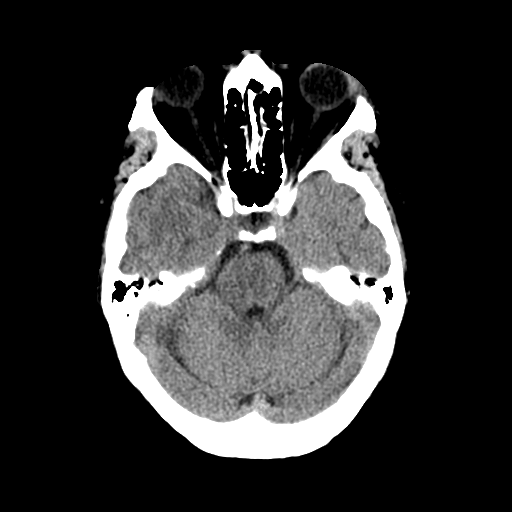
[im 12/31  brain]
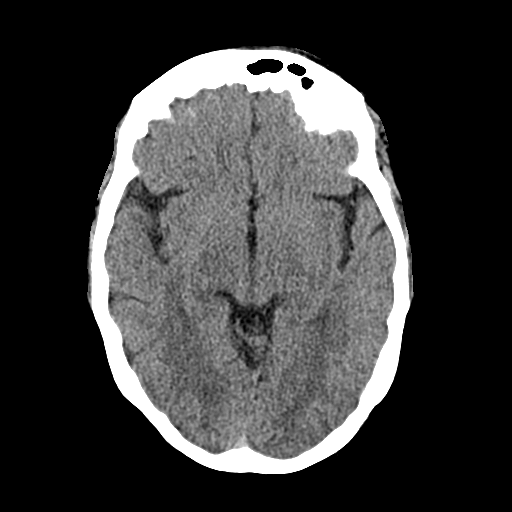
[im 16/31  brain]
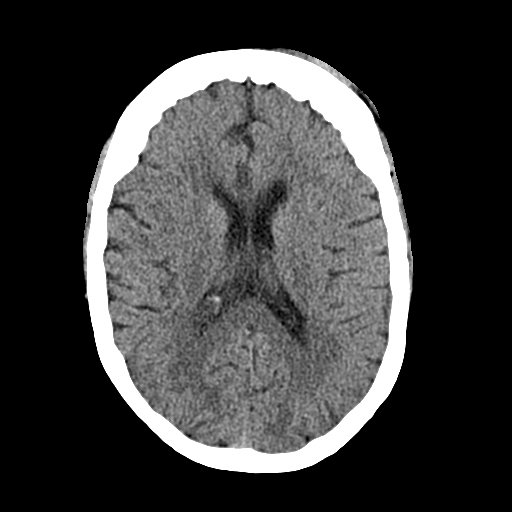
[im 19/31  brain]
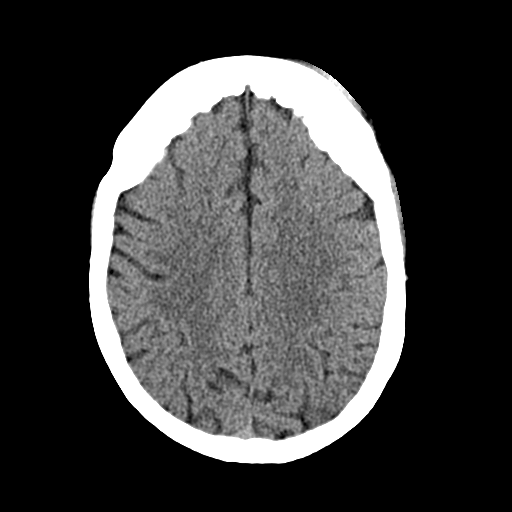
[im 19/31  bone]
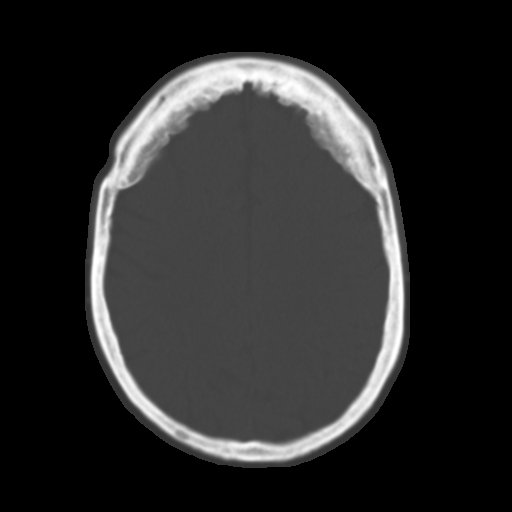
[im 23/31  brain]
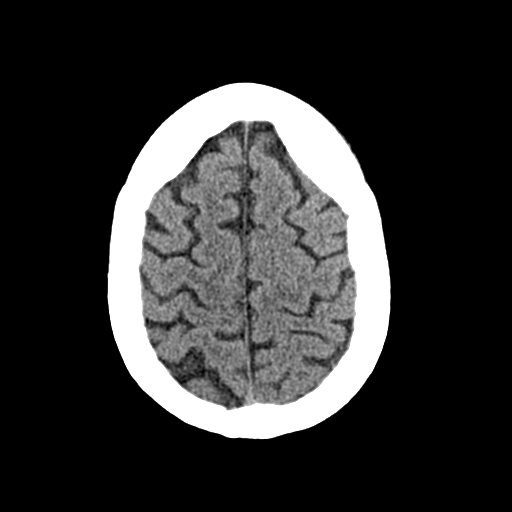
[im 27/31  brain]
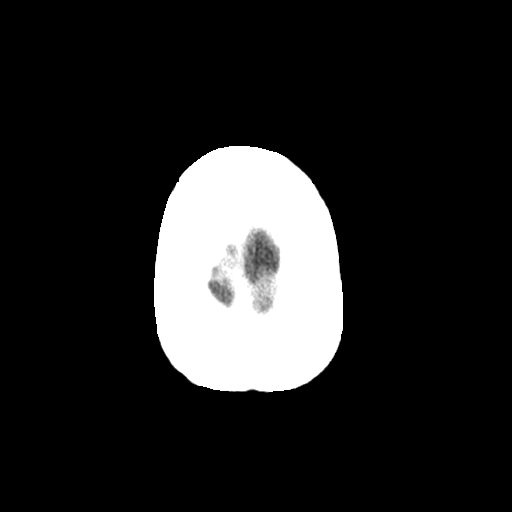

[Series 3: head bone · axial · 0.44mm/px · z∈[-151,-119]mm · 3 of 78 slices shown]
[im 8/78  bone]
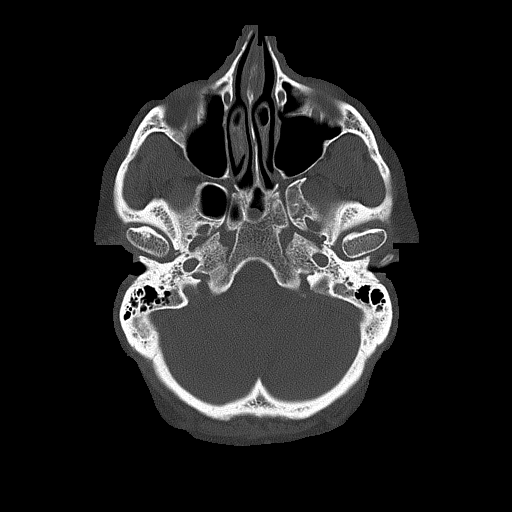
[im 16/78  bone]
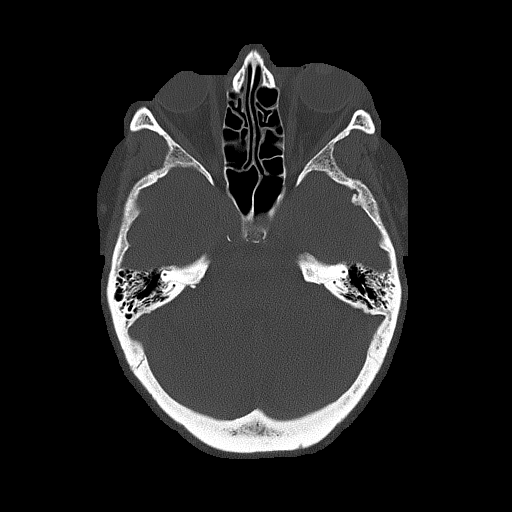
[im 24/78  bone]
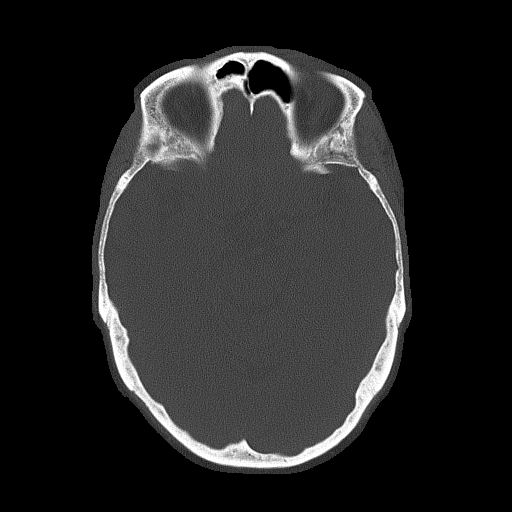

[Series 4: cor head wo · coronal · 0.32mm/px · 3 of 67 slices shown]
[im 23/67  brain]
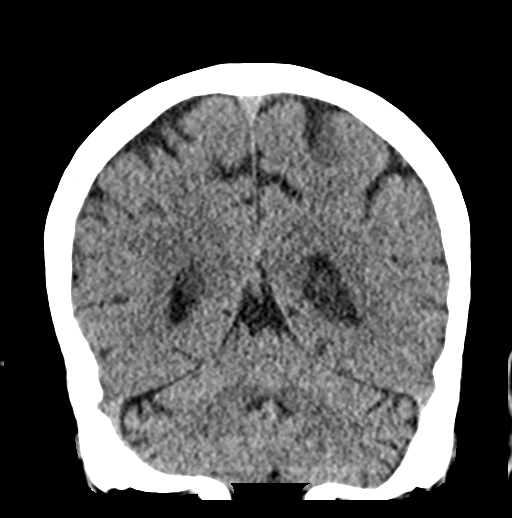
[im 30/67  brain]
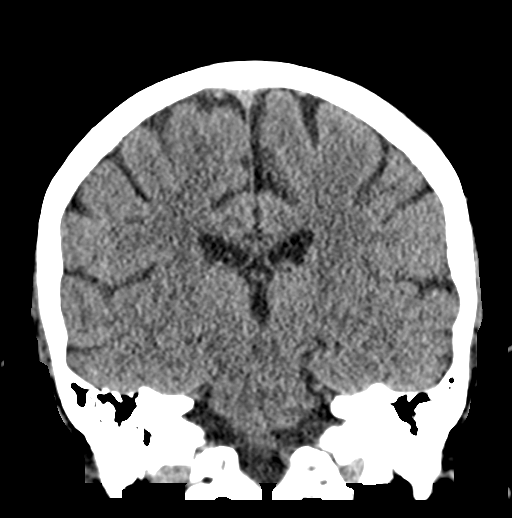
[im 37/67  brain]
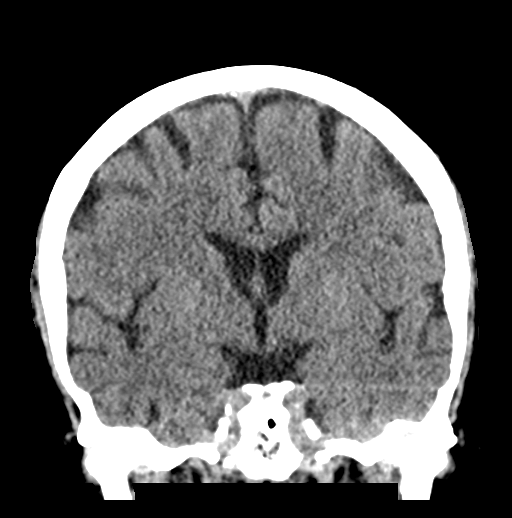

[Series 5: sag head wo · sagittal · 0.31mm/px · 3 of 54 slices shown]
[im 18/54  brain]
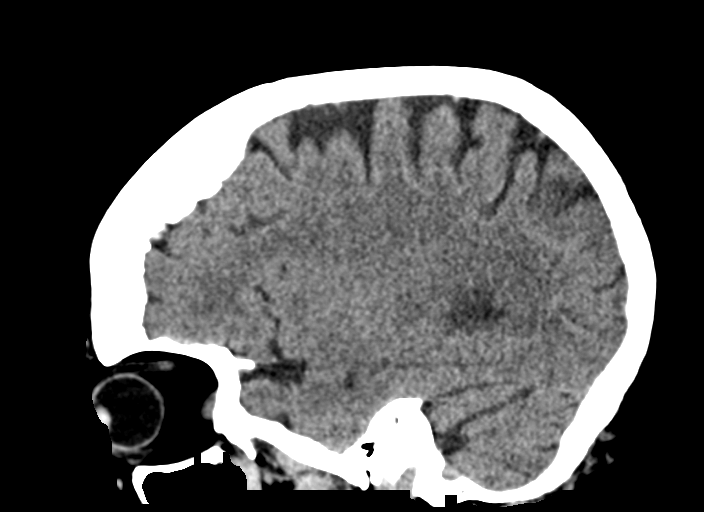
[im 27/54  brain]
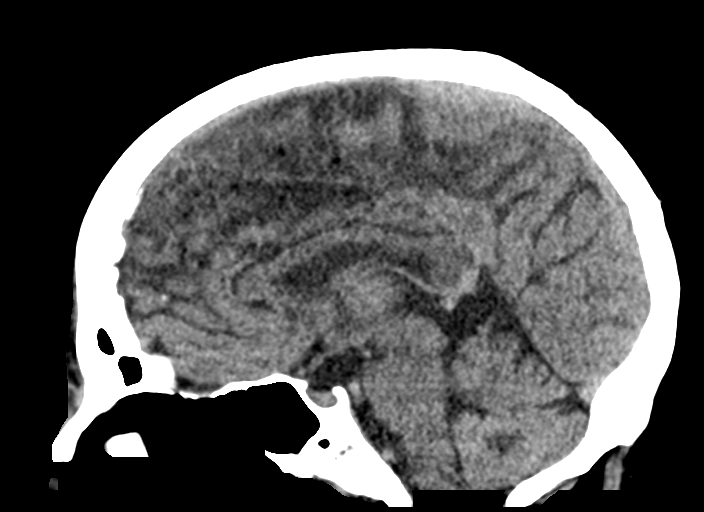
[im 36/54  brain]
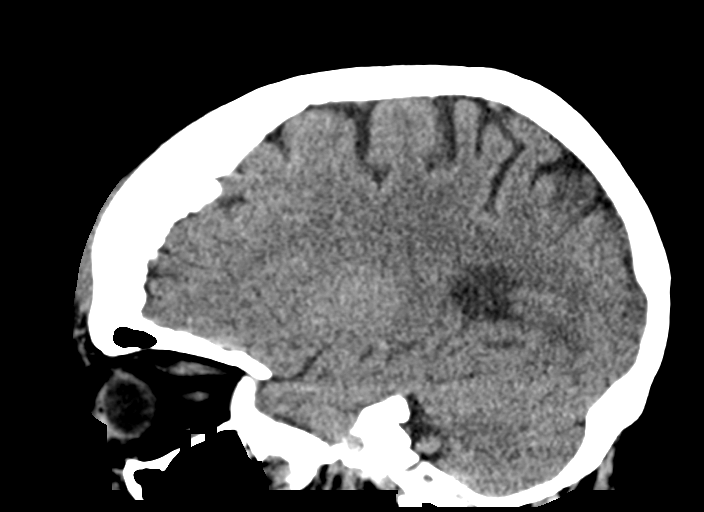

[16 of 47 positions shown; findings below may reference images not displayed]

FINDINGS: CT HEAD FINDINGS

Brain: There is no evidence for acute hemorrhage, hydrocephalus,
mass lesion, or abnormal extra-axial fluid collection. No definite
CT evidence for acute infarction.

Vascular: No hyperdense vessel or unexpected calcification.

Skull: No evidence for fracture. No worrisome lytic or sclerotic
lesion.

Other: Left frontal scalp contusion evident.

CT MAXILLOFACIAL FINDINGS

Osseous: No fracture or mandibular dislocation. No destructive
process.

Orbits: No acute bony abnormality. Globes are symmetric in size and
shape. Prominent soft tissue swelling noted over the left orbital
region.

Sinuses: Scattered mucosal thickening noted in ethmoid air cells and
right sphenoid sinus.

Soft tissues: Swelling over the left orbital region.
IMPRESSION: 1. No acute intracranial abnormality.
2. Left frontal scalp contusion with prominent soft tissue swelling
over the left orbital region.
3. No evidence for maxillofacial fracture.

## 2021-05-23 ENCOUNTER — Other Ambulatory Visit: Payer: Self-pay | Admitting: Internal Medicine

## 2021-05-23 DIAGNOSIS — J310 Chronic rhinitis: Secondary | ICD-10-CM

## 2021-05-23 DIAGNOSIS — G4700 Insomnia, unspecified: Secondary | ICD-10-CM

## 2021-05-24 ENCOUNTER — Other Ambulatory Visit: Payer: Self-pay

## 2021-05-24 ENCOUNTER — Institutional Professional Consult (permissible substitution): Payer: Medicare Other | Admitting: Behavioral Health

## 2021-06-06 ENCOUNTER — Ambulatory Visit (INDEPENDENT_AMBULATORY_CARE_PROVIDER_SITE_OTHER): Payer: Medicare Other | Admitting: Internal Medicine

## 2021-06-06 VITALS — BP 149/78 | HR 73 | Temp 98.2°F | Ht 64.0 in | Wt 200.9 lb

## 2021-06-06 DIAGNOSIS — K219 Gastro-esophageal reflux disease without esophagitis: Secondary | ICD-10-CM | POA: Diagnosis not present

## 2021-06-06 DIAGNOSIS — E782 Mixed hyperlipidemia: Secondary | ICD-10-CM | POA: Diagnosis not present

## 2021-06-06 DIAGNOSIS — E6609 Other obesity due to excess calories: Secondary | ICD-10-CM | POA: Diagnosis not present

## 2021-06-06 DIAGNOSIS — F410 Panic disorder [episodic paroxysmal anxiety] without agoraphobia: Secondary | ICD-10-CM | POA: Diagnosis not present

## 2021-06-06 DIAGNOSIS — Z0000 Encounter for general adult medical examination without abnormal findings: Secondary | ICD-10-CM

## 2021-06-06 DIAGNOSIS — F411 Generalized anxiety disorder: Secondary | ICD-10-CM | POA: Diagnosis not present

## 2021-06-06 DIAGNOSIS — I10 Essential (primary) hypertension: Secondary | ICD-10-CM

## 2021-06-06 DIAGNOSIS — R739 Hyperglycemia, unspecified: Secondary | ICD-10-CM | POA: Diagnosis not present

## 2021-06-06 DIAGNOSIS — Z0100 Encounter for examination of eyes and vision without abnormal findings: Secondary | ICD-10-CM

## 2021-06-06 LAB — POCT GLYCOSYLATED HEMOGLOBIN (HGB A1C): Hemoglobin A1C: 5.3 % (ref 4.0–5.6)

## 2021-06-06 LAB — GLUCOSE, CAPILLARY: Glucose-Capillary: 84 mg/dL (ref 70–99)

## 2021-06-06 MED ORDER — OMEPRAZOLE 20 MG PO CPDR: 20.0000 mg | DELAYED_RELEASE_CAPSULE | Freq: Every day | ORAL | 1 refills | Status: DC

## 2021-06-06 NOTE — Patient Instructions (Signed)
Thank you, [name] for allowing [place] to provide your care today. Today we discussed cholesterol, anxiety, and blood pressure ? ?Labs/Tests Ordered: ? ?Lab Orders    ?     BMP8+Anion Gap    ?     Lipid Profile    ?     POC Hbg A1C     ? ?Referrals Ordered:  ? ?Referral Orders    ?     Ambulatory referral to Ophthalmology     ? ?Medication Changes:  ?There are no discontinued medications.  ? ?Meds ordered this encounter  ?Medications  ? omeprazole (PRILOSEC) 20 MG capsule  ?  Sig: Take 1 capsule (20 mg total) by mouth daily.  ?  Dispense:  30 capsule  ?  Refill:  1  ?  ? ?Health Maintenance Screening: ?There are no preventive care reminders to display for this patient.  ? ?Instructions:  ? ?Follow up: 1 year  ? ?Remember: If you have any questions or concerns, call our clinic at [phone] or after hours call [phone] and ask for the internal medicine resident on call. ? ?Dellia Cloud, D.O. ?[facility] ? ?  ?

## 2021-06-06 NOTE — Progress Notes (Signed)
? ? ?Subjective:  ?CC: HTN ? ?HPI: ? ?[name] is a 71 y.o. female with a past medical history stated below and presents today for HTN. Please see problem based assessment and plan for additional details. ? ?Past Medical History:  ?Diagnosis Date  ? B12 deficiency   ? Dry eye   ? HTN (hypertension)   ? Hyperlipidemia   ? Insomnia   ? MDD (major depressive disorder)   ? ? ?Current Outpatient Medications on File Prior to Visit  ?Medication Sig Dispense Refill  ? amLODipine (NORVASC) 5 MG tablet TAKE 1 TABLET BY MOUTH EVERY DAY 90 tablet 1  ? atorvastatin (LIPITOR) 80 MG tablet TAKE 1 TABLET BY MOUTH DAILY 90 tablet 1  ? busPIRone (BUSPAR) 10 MG tablet TAKE 1 TABLET BY MOUTH THREE TIMES A DAY AS NEEDED 90 tablet 0  ? cycloSPORINE (RESTASIS) 0.05 % ophthalmic emulsion 1 drop 2 (two) times daily.    ? fluticasone (FLONASE) 50 MCG/ACT nasal spray 1 SPRAY IN EACH NOSTRIL EVERY DAY 16 mL 2  ? losartan (COZAAR) 100 MG tablet TAKE 1 TABLET BY MOUTH EVERY DAY 90 tablet 1  ? sertraline (ZOLOFT) 100 MG tablet Take 1 tablet (100 mg total) by mouth daily. 90 tablet 3  ? traZODone (DESYREL) 100 MG tablet Take 1 tablet (100 mg total) by mouth at bedtime. 90 tablet 3  ? traZODone (DESYREL) 50 MG tablet TAKE 1 TABLET BY MOUTH EVERYDAY AT BEDTIME 30 tablet 3  ? vitamin B-12 (CYANOCOBALAMIN) 1000 MCG tablet TAKE 1 TABLET(1000 MCG) BY MOUTH DAILY 100 tablet 2  ? ?No current facility-administered medications on file prior to visit.  ? ? ?Family History  ?Problem Relation Age of Onset  ? Congestive Heart Failure Father   ? ? ?Social History  ? ?Socioeconomic History  ? Marital status: Married  ?  Spouse name: Not on file  ? Number of children: Not on file  ? Years of education: Not on file  ? Highest education level: Not on file  ?Occupational History  ? Not on file  ?Tobacco Use  ? Smoking status: Never  ? Smokeless tobacco: Never  ?Substance and Sexual Activity  ? Alcohol use: Yes  ?  Alcohol/week: 7.0 standard drinks  ?   Types: 7 Glasses of wine per week  ?  Comment: Daily  ? Drug use: Never  ? Sexual activity: Yes  ?  Partners: Male  ?Other Topics Concern  ? Not on file  ?Social History Narrative  ? Not on file  ? ?Social Determinants of Health  ? ?Financial Resource Strain: Not on file  ?Food Insecurity: No Food Insecurity  ? Worried About Programme researcher, broadcasting/film/video in the Last Year: Never true  ? Ran Out of Food in the Last Year: Never true  ?Transportation Needs: No Transportation Needs  ? Lack of Transportation (Medical): No  ? Lack of Transportation (Non-Medical): No  ?Physical Activity: Not on file  ?Stress: Not on file  ?Social Connections: Not on file  ?Intimate Partner Violence: Not on file  ? ? ?Review of Systems: ?ROS negative except for what is noted on the assessment and plan. ? ?Objective:  ? ?Vitals:  ? 06/06/21 1434 06/06/21 1600  ?BP: (!) 143/86 (!) 149/78  ?Pulse: 95 73  ?Temp: 98.2 ?F (36.8 ?C)   ?TempSrc: Oral   ?SpO2: 95%   ?Weight: 200 lb 14.4 oz (91.1 kg)   ?Height: 5\' 4"  (1.626 m)   ? ? ?Physical Exam: ?Gen: A&O  x3 and in no apparent distress, well appearing and nourished. ?CV: RRR, no murmurs, S1/S2 presents  ?Resp: Clear to ascultation bilaterally  ?Abd: BS (+) x4, soft, non-tender abdomen, without hepatosplenomegaly or masses ?MSK: Grossly normal AROM and strength x4 extremities. ?Skin: good skin turgor, no rashes, unusual bruising, or prominent lesions.  ?Neuro: No focal deficits, grossly normal sensation and coordination.  ?Psych: Oriented x3 and responding appropriately. Intact memory, normal mood, judgement, affect, and insight.  ? ? ?Assessment & Plan:  ?See Encounters Tab for problem based charting. ? ?Patient discussed with Dr. Oswaldo Done ? ? ?Dellia Cloud, D.O. ?[facility]  PGY-3 ?Pager: [phone]  Phone: [phone] ?Date 06/07/2021  Time 11:31 AM  ?

## 2021-06-07 ENCOUNTER — Encounter: Payer: Self-pay | Admitting: Internal Medicine

## 2021-06-07 LAB — BMP8+ANION GAP
Anion Gap: 17 mmol/L (ref 10.0–18.0)
BUN/Creatinine Ratio: 21 (ref 12–28)
BUN: 21 mg/dL (ref 8–27)
CO2: 24 mmol/L (ref 20–29)
Calcium: 9.1 mg/dL (ref 8.7–10.3)
Chloride: 100 mmol/L (ref 96–106)
Creatinine, Ser: 0.99 mg/dL (ref 0.57–1.00)
Glucose: 82 mg/dL (ref 70–99)
Potassium: 4.6 mmol/L (ref 3.5–5.2)
Sodium: 141 mmol/L (ref 134–144)
eGFR: 62 mL/min/{1.73_m2} (ref >59)

## 2021-06-07 LAB — LIPID PANEL
Chol/HDL Ratio: 2.3 ratio (ref 0.0–4.4)
Cholesterol, Total: 169 mg/dL (ref 100–199)
HDL: 74 mg/dL (ref >39)
LDL Chol Calc (NIH): 59 mg/dL (ref 0–99)
Triglycerides: 229 mg/dL — ABNORMAL HIGH (ref 0–149)
VLDL Cholesterol Cal: 36 mg/dL (ref 5–40)

## 2021-06-07 NOTE — Assessment & Plan Note (Signed)
Patient presented for follow-up of hypertension.  She is currently taking losartan 100 mg daily.  Blood pressure today is 143/86.  She was previously on amlodipine but was discontinued due to orthostatic hypotension.  She has been taking her blood pressures at home along with SBP's ranging from 108-134 and DBP's of 74-82. ? ?Plan: ?-Continue losartan 100 mg daily ?-BMP for kidney function and electrolytes ?-Asked patient to bring his blood pressure cuff at next visit to compare to ours. ?

## 2021-06-07 NOTE — Progress Notes (Signed)
Internal Medicine Clinic Attending  Case discussed with Dr. Coe  At the time of the visit.  We reviewed the resident's history and exam and pertinent patient test results.  I agree with the assessment, diagnosis, and plan of care documented in the resident's note.  

## 2021-06-07 NOTE — Assessment & Plan Note (Addendum)
Patient continues to try to lose weight.  Patient has lots 10 pounds over the last several months and is trying to eat less processed foods.  Encouraged her with his progress and emphasized importance of low-impact exercise with walking daily to help with weight loss.  Considering patient has history of hyperlipidemia, hypertension, and obesity, will screen her for diabetes today with an A1c ?

## 2021-06-07 NOTE — Assessment & Plan Note (Signed)
Patient asked for referral to ophthalmology for an eye exam she denies any symptoms. ?

## 2021-06-07 NOTE — Assessment & Plan Note (Signed)
Patient apparently has elevated cholesterol from previous lipid panels.  She is currently on atorvastatin 80 mg. ? ?Lipid Panel  ?   ?Component Value Date/Time  ? CHOL 169 06/06/2021 1514  ? TRIG 229 (H) 06/06/2021 1514  ? HDL 74 06/06/2021 1514  ? CHOLHDL 2.3 06/06/2021 1514  ? LDLCALC 59 06/06/2021 1514  ? LABVLDL 36 06/06/2021 1514  ? ? ?Plan: ?-Lipid panel today. ?

## 2021-06-07 NOTE — Assessment & Plan Note (Signed)
Patient has a longstanding history of gastroesophageal reflux.  She describes substernal chest pain that is burning in nature and radiates upwards with dyspepsia and frequent belching.  She states the symptoms are not new but she has not been taking any medications for them.  She denies any high risk behaviors including chronic NSAIDs or alcohol/ tobacco use.  She denies early satiety, nausea, vomiting, or difficulty swallowing. ? ?Plan: ?-We will do a trial of omeprazole 20 mg daily for 8 weeks ?

## 2021-06-07 NOTE — Assessment & Plan Note (Addendum)
Patient presents for further evaluation management of anxiety and panic disorder.  She is on buspirone, sertraline, and trazodone.  The trazodone was used for difficulty with sleeping.  Management during that time counseling her regarding her risk of possible side effects from these medications including serotonin syndrome.  On evaluation she does not currently have any signs or symptoms of serotonin syndrome as her neurovascular exam is ? ?GAD-7 with a score of 1 today. ? ?Plan:  ?-Counseled regarding risks and benefits of medication management ?-Discussed possibility of psychiatry referral in the future further medication changes need to be made ?

## 2021-06-20 ENCOUNTER — Ambulatory Visit: Payer: Medicare Other | Admitting: Behavioral Health

## 2021-06-20 DIAGNOSIS — F419 Anxiety disorder, unspecified: Secondary | ICD-10-CM

## 2021-06-20 DIAGNOSIS — F331 Major depressive disorder, recurrent, moderate: Secondary | ICD-10-CM

## 2021-06-20 NOTE — BH Specialist Note (Signed)
Integrated Behavioral Health via Telemedicine Visit ? ?06/20/2021 ?Brenda Powell ?[id number] ? ?Number of Integrated Behavioral Health Clinician visits: 2 ?Session Start time: 1130 ?Session End time: 1200 ?Total time in minutes: 30 min ? ?Referring Provider: Dr. Orinda Kenner, MD ?Patient/Family location: Pt is home in private in her bedroom ?[facility] Provider location: [facility] Office ?All persons participating in visit: Pt & Clinician ?Types of Service: Individual psychotherapy ? ?I connected with Brenda Powell and/or Brenda Powell  self  via  Telephone or Video Enabled Telemedicine Application  (Video is Caregility application) and verified that I am speaking with the correct person using two identifiers. Discussed confidentiality: Yes  ? ?I discussed the limitations of telemedicine and the availability of in person appointments.  Discussed there is a possibility of technology failure and discussed alternative modes of communication if that failure occurs. ? ?I discussed that engaging in this telemedicine visit, they consent to the provision of behavioral healthcare and the services will be billed under their insurance. ? ?Patient and/or legal guardian expressed understanding and consented to Telemedicine visit: Yes  ? ?Presenting Concerns: ?Patient and/or family reports the following symptoms/concerns: elevated anx/dep due to her Husb Brenda Powell's poor state of health; she is worried for him since his toe surgery altered his Px coping as he has poor balance & is sedentary. He is shaky & walks slowly since his big toe was partially amputated. Pt has been his caregiver since the surgery. Pt finds herself fearful of him giving up on life & she might wake up one day & he is dead. ?Duration of problem: since foot surgery Husb has been inc'ly depressed; Severity of problem: mild for Pt & Husb also impacted by familial Hx of maternal dep ? ?Patient and/or Family's Strengths/Protective Factors: ?Social and Emotional competence,  Concrete supports in place (healthy food, safe environments, etc.), Sense of purpose, and Physical Health (exercise, healthy diet, medication compliance, etc.) ? ?Goals Addressed: ?Patient will: ? Reduce symptoms of: anxiety and depression  ? Increase knowledge and/or ability of: coping skills, healthy habits, and stress reduction  ? Demonstrate ability to: Increase healthy adjustment to current life circumstances ?4. Make f/u appt w/Husb's PCP & ask for review of his anti-depressant medication ?Progress towards Goals: ?Ongoing ? ?Interventions: ?Interventions utilized:  Solution-Focused Strategies and Behavioral Activation ?Standardized Assessments completed:  screeners prn ? ?Patient and/or Family Response: Pt is receptive to call today & requests future appt ? ?Assessment: ?Patient currently experiencing inc'd sense of fear due to Husb's current poor health. Discussed the likelihood of Pt's Husb feeling dep'd due to his changes in health status. Pt fears her Husb may be, "giving up". ? ?Patient may benefit from engagement in activities w/Husb & encouragement of his invlment in tasks that bring him inc'd mental health wellness. ? ?Suggested Pt purchase any type of puzzles, word-search, 3-D, crossword, & playing card & board games to get his mental health wellness jumpstarted. Offered Cpl Th session @ next visit. ? ?Plan: ?Follow up with behavioral health clinician on : 2-3 wks for 60 min on telehealth ?Behavioral recommendations: Find these materials & try to interest Husb in interactions.  ?Referral(s): Integrated [employer] (In Clinic) ? ?I discussed the assessment and treatment plan with the patient and/or parent/guardian. They were provided an opportunity to ask questions and all were answered. They agreed with the plan and demonstrated an understanding of the instructions. ?  ?They were advised to call back or seek an in-person evaluation if the symptoms worsen or if the  condition fails to  improve as anticipated. ? ?Brenda Lever, LMFT ?

## 2021-06-24 ENCOUNTER — Other Ambulatory Visit: Payer: Self-pay | Admitting: Internal Medicine

## 2021-06-24 DIAGNOSIS — G4700 Insomnia, unspecified: Secondary | ICD-10-CM

## 2021-07-02 ENCOUNTER — Ambulatory Visit: Payer: Medicare Other | Admitting: Behavioral Health

## 2021-07-02 DIAGNOSIS — F419 Anxiety disorder, unspecified: Secondary | ICD-10-CM

## 2021-07-02 DIAGNOSIS — F4322 Adjustment disorder with anxiety: Secondary | ICD-10-CM

## 2021-07-02 DIAGNOSIS — F331 Major depressive disorder, recurrent, moderate: Secondary | ICD-10-CM

## 2021-07-02 NOTE — BH Specialist Note (Signed)
Integrated Behavioral Health via Telemedicine Visit ? ?07/02/2021 ?Cheyne Bungert ?[id number] ? ?Number of Integrated Behavioral Health Clinician visits: 3 ?Session Start time: 1130 ?Session End time: 1200 ?Total time in minutes: 30 min ? ?Referring Provider: Dr. Orinda Kenner, MD ?Patient/Family location: Pt is home in private ?[facility] Provider location: [facility] Office ?All persons participating in visit: Pt & Clinician ?Types of Service: Individual psychotherapy ? ?I connected with [name] and/or [name]  self  via  Telephone or Video Enabled Telemedicine Application  (Video is Caregility application) and verified that I am speaking with the correct person using two identifiers. Discussed confidentiality: Yes  ? ?I discussed the limitations of telemedicine and the availability of in person appointments.  Discussed there is a possibility of technology failure and discussed alternative modes of communication if that failure occurs. ? ?I discussed that engaging in this telemedicine visit, they consent to the provision of behavioral healthcare and the services will be billed under their insurance. ? ?Patient and/or legal guardian expressed understanding and consented to Telemedicine visit: Yes  ? ?Presenting Concerns: ?Patient and/or family reports the following symptoms/concerns: elevated anx/dep due to Husb's health situation; having deteriorated over the past 1 1/2 yrs since his partial big toe amputation. Husb is extremely sedentary, is consuming less nutrition & is suffereing from inc'd dep in the past months ? ?Duration of problem: almost 1 1/2 yrs of steady decline noted by Wife Kairee; Severity of problem: moderate ? ?Patient and/or Family's Strengths/Protective Factors: ?Social and Emotional competence, Concrete supports in place (healthy food, safe environments, etc.), Sense of purpose, and Physical Health (exercise, healthy diet, medication compliance, etc.) ? ?Goals Addressed: ?Patient will: ? Reduce  symptoms of: anxiety, depression, and stress  ? Increase knowledge and/or ability of: coping skills, healthy habits, and stress reduction due to inc in caregiver duties. Husb has recently relinquished his driving responsibilities to Pt. ? Demonstrate ability to: Increase healthy adjustment to current life circumstances ? ?Progress towards Goals: ?Ongoing ? ?Interventions: ?Interventions utilized:  Solution-Focused Strategies, Mindfulness or Relaxation Training, Medication Monitoring, and Supportive Counseling ?Standardized Assessments completed:  screeners prn ? ?Patient and/or Family Response: Pt is receptive to call & not certain the call would assist her. After visit, Pt is clear she needs someone to talk with bc she feels so improved afterwards having voiced all her concerns/worries.  ? ?Assessment: ?Patient currently experiencing inc in anx/dep due to worry for Husb & his well-being. Pt has noticed a steady decline in his Px & mental health over the past year & a half. She has secured an [facility] visit with his PCP to review what is happening & address his needs. Pt has witnessed him SOB upon minimal exertion, excessive sleeping for 16+ hrs/day, reduced appetite & Sx of inc'd dep. Pt wants him to have the support of PT, medication review for dep, & possible Cognitive Testing since she has witnessed him w/reduced orientation (not knowing where he is), & asking when his dead Str is going to wake up. ? ?Patient may benefit from cont'd Cslg to address Pt's specific needs & provide encouragement & support for aging Spouse w/inc'd difficultites. ? ?Plan: ?Follow up with behavioral health clinician on : Next avail for 30 min ck-in ?Behavioral recommendations: We will hopefully meet briefly on Thur when Cpl is here for [facility] visit ?Referral(s): Integrated [employer] (In Clinic) ? ?I discussed the assessment and treatment plan with the patient and/or parent/guardian. They were provided an opportunity to ask  questions and all  were answered. They agreed with the plan and demonstrated an understanding of the instructions. ?  ?They were advised to call back or seek an in-person evaluation if the symptoms worsen or if the condition fails to improve as anticipated. ? ?Deneise Lever, LMFT ?

## 2021-07-13 ENCOUNTER — Encounter: Payer: Self-pay | Admitting: Internal Medicine

## 2021-07-16 ENCOUNTER — Ambulatory Visit: Payer: Medicare Other | Admitting: Behavioral Health

## 2021-07-25 DIAGNOSIS — M25562 Pain in left knee: Secondary | ICD-10-CM | POA: Diagnosis not present

## 2021-07-25 DIAGNOSIS — M65312 Trigger thumb, left thumb: Secondary | ICD-10-CM | POA: Diagnosis not present

## 2021-07-26 ENCOUNTER — Other Ambulatory Visit: Payer: Self-pay | Admitting: Internal Medicine

## 2021-07-26 DIAGNOSIS — G4700 Insomnia, unspecified: Secondary | ICD-10-CM

## 2021-07-27 ENCOUNTER — Telehealth: Payer: Self-pay | Admitting: *Deleted

## 2021-07-27 DIAGNOSIS — S134XXA Sprain of ligaments of cervical spine, initial encounter: Secondary | ICD-10-CM | POA: Diagnosis not present

## 2021-07-27 NOTE — Telephone Encounter (Signed)
I agree with ED visit
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
## 2021-07-30 ENCOUNTER — Ambulatory Visit: Payer: Medicare Other | Admitting: Behavioral Health

## 2021-08-05 ENCOUNTER — Other Ambulatory Visit: Payer: Self-pay | Admitting: Internal Medicine

## 2021-08-05 DIAGNOSIS — F410 Panic disorder [episodic paroxysmal anxiety] without agoraphobia: Secondary | ICD-10-CM

## 2021-08-06 DIAGNOSIS — H2513 Age-related nuclear cataract, bilateral: Secondary | ICD-10-CM | POA: Diagnosis not present

## 2021-08-06 DIAGNOSIS — H25013 Cortical age-related cataract, bilateral: Secondary | ICD-10-CM | POA: Diagnosis not present

## 2021-08-06 DIAGNOSIS — H5203 Hypermetropia, bilateral: Secondary | ICD-10-CM | POA: Diagnosis not present

## 2021-08-06 DIAGNOSIS — H52203 Unspecified astigmatism, bilateral: Secondary | ICD-10-CM | POA: Diagnosis not present

## 2021-08-06 DIAGNOSIS — H04123 Dry eye syndrome of bilateral lacrimal glands: Secondary | ICD-10-CM | POA: Diagnosis not present

## 2021-08-06 DIAGNOSIS — H524 Presbyopia: Secondary | ICD-10-CM | POA: Diagnosis not present

## 2021-09-22 ENCOUNTER — Encounter: Payer: Self-pay | Admitting: *Deleted

## 2021-10-05 ENCOUNTER — Other Ambulatory Visit: Payer: Self-pay | Admitting: Internal Medicine

## 2021-10-05 DIAGNOSIS — G4700 Insomnia, unspecified: Secondary | ICD-10-CM

## 2021-10-05 DIAGNOSIS — F410 Panic disorder [episodic paroxysmal anxiety] without agoraphobia: Secondary | ICD-10-CM

## 2021-10-24 DIAGNOSIS — M65312 Trigger thumb, left thumb: Secondary | ICD-10-CM | POA: Diagnosis not present

## 2021-10-24 DIAGNOSIS — G5603 Carpal tunnel syndrome, bilateral upper limbs: Secondary | ICD-10-CM | POA: Diagnosis not present

## 2021-11-09 ENCOUNTER — Other Ambulatory Visit: Payer: Self-pay | Admitting: Internal Medicine

## 2021-11-10 ENCOUNTER — Other Ambulatory Visit: Payer: Self-pay | Admitting: Internal Medicine

## 2021-11-10 DIAGNOSIS — G4700 Insomnia, unspecified: Secondary | ICD-10-CM

## 2021-12-05 DIAGNOSIS — M65312 Trigger thumb, left thumb: Secondary | ICD-10-CM | POA: Diagnosis not present

## 2021-12-05 DIAGNOSIS — G5603 Carpal tunnel syndrome, bilateral upper limbs: Secondary | ICD-10-CM | POA: Diagnosis not present

## 2021-12-13 ENCOUNTER — Encounter: Payer: Self-pay | Admitting: Internal Medicine

## 2021-12-18 ENCOUNTER — Other Ambulatory Visit: Payer: Self-pay | Admitting: Internal Medicine

## 2021-12-18 DIAGNOSIS — F410 Panic disorder [episodic paroxysmal anxiety] without agoraphobia: Secondary | ICD-10-CM

## 2021-12-18 DIAGNOSIS — G5602 Carpal tunnel syndrome, left upper limb: Secondary | ICD-10-CM | POA: Diagnosis not present

## 2021-12-19 ENCOUNTER — Other Ambulatory Visit: Payer: Self-pay | Admitting: Internal Medicine

## 2021-12-19 DIAGNOSIS — G4700 Insomnia, unspecified: Secondary | ICD-10-CM

## 2021-12-20 DIAGNOSIS — M1812 Unilateral primary osteoarthritis of first carpometacarpal joint, left hand: Secondary | ICD-10-CM | POA: Diagnosis not present

## 2021-12-24 ENCOUNTER — Ambulatory Visit (INDEPENDENT_AMBULATORY_CARE_PROVIDER_SITE_OTHER): Payer: Medicare Other

## 2021-12-24 ENCOUNTER — Ambulatory Visit: Payer: Medicare Other

## 2021-12-24 DIAGNOSIS — Z23 Encounter for immunization: Secondary | ICD-10-CM | POA: Diagnosis not present

## 2022-01-11 DIAGNOSIS — M65312 Trigger thumb, left thumb: Secondary | ICD-10-CM | POA: Diagnosis not present

## 2022-01-11 DIAGNOSIS — G5602 Carpal tunnel syndrome, left upper limb: Secondary | ICD-10-CM | POA: Diagnosis not present

## 2022-01-24 DIAGNOSIS — G5601 Carpal tunnel syndrome, right upper limb: Secondary | ICD-10-CM | POA: Diagnosis not present

## 2022-01-26 ENCOUNTER — Other Ambulatory Visit: Payer: Self-pay | Admitting: Internal Medicine

## 2022-01-26 DIAGNOSIS — G4700 Insomnia, unspecified: Secondary | ICD-10-CM

## 2022-02-01 ENCOUNTER — Other Ambulatory Visit: Payer: Self-pay

## 2022-02-01 MED ORDER — ATORVASTATIN CALCIUM 80 MG PO TABS: ORAL_TABLET | ORAL | 3 refills | Status: DC

## 2022-02-01 NOTE — Telephone Encounter (Signed)
  atorvastatin (LIPITOR) 80 MG tablet, refill request @  [id number] - [place], [place] - [address] AT [address].

## 2022-02-05 ENCOUNTER — Encounter: Payer: Self-pay | Admitting: Internal Medicine

## 2022-02-13 ENCOUNTER — Ambulatory Visit: Payer: Medicare Other

## 2022-02-14 ENCOUNTER — Ambulatory Visit: Payer: Medicare Other

## 2022-02-14 ENCOUNTER — Encounter: Payer: Medicare Other | Admitting: Student

## 2022-02-18 ENCOUNTER — Other Ambulatory Visit: Payer: Self-pay | Admitting: Internal Medicine

## 2022-02-18 DIAGNOSIS — F410 Panic disorder [episodic paroxysmal anxiety] without agoraphobia: Secondary | ICD-10-CM

## 2022-02-18 DIAGNOSIS — G4700 Insomnia, unspecified: Secondary | ICD-10-CM

## 2022-02-19 NOTE — Telephone Encounter (Signed)
Pt had canceled several appointments. Called pt to schedule an appt with her PCP - appt scheduled w/Dr Sol Blazing 04/29/22 @ [id number].

## 2022-02-19 NOTE — Telephone Encounter (Signed)
Pt had canceled several appointments. Called pt to schedule an appt with her PCP - appt scheduled w/Dr Lau 04/29/22 @ 0815AM. 

## 2022-02-27 DIAGNOSIS — G5601 Carpal tunnel syndrome, right upper limb: Secondary | ICD-10-CM | POA: Diagnosis not present

## 2022-03-02 ENCOUNTER — Other Ambulatory Visit: Payer: Self-pay | Admitting: Internal Medicine

## 2022-03-02 DIAGNOSIS — G4700 Insomnia, unspecified: Secondary | ICD-10-CM

## 2022-03-06 DIAGNOSIS — G5601 Carpal tunnel syndrome, right upper limb: Secondary | ICD-10-CM | POA: Diagnosis not present

## 2022-04-02 ENCOUNTER — Telehealth: Payer: Self-pay

## 2022-04-02 ENCOUNTER — Other Ambulatory Visit: Payer: Self-pay | Admitting: Internal Medicine

## 2022-04-02 DIAGNOSIS — G4700 Insomnia, unspecified: Secondary | ICD-10-CM

## 2022-04-02 MED ORDER — BUSPIRONE HCL 10 MG PO TABS: 10.0000 mg | ORAL_TABLET | Freq: Three times a day (TID) | ORAL | 0 refills | Status: DC | PRN

## 2022-04-02 NOTE — Telephone Encounter (Signed)
Returned patient's call. Recommended acetaminophen up to 1000 mg q8h prn. Okay to use ibuprofen 400 mg for severe pain if needed, advised one time per day as needed given ARB use. Recommended lidocaine patch if she would like to try something topical, she has used BioFreeze with some benefit which is okay to continue.

## 2022-04-02 NOTE — Telephone Encounter (Signed)
Patient called she is having sciatica back pain patient wants to know what she can do to relieve the pain until her appointment this Friday 04/05/2022

## 2022-04-02 NOTE — Addendum Note (Signed)
Addended by: Charise Killian on: 04/02/2022 04:17 PM   Modules accepted: Orders

## 2022-04-05 ENCOUNTER — Encounter: Payer: Self-pay | Admitting: Internal Medicine

## 2022-04-05 ENCOUNTER — Ambulatory Visit (INDEPENDENT_AMBULATORY_CARE_PROVIDER_SITE_OTHER): Payer: Medicare Other | Admitting: Internal Medicine

## 2022-04-05 ENCOUNTER — Other Ambulatory Visit: Payer: Self-pay

## 2022-04-05 VITALS — BP 116/79 | HR 98 | Temp 98.0°F | Resp 28 | Ht 64.0 in | Wt 193.7 lb

## 2022-04-05 DIAGNOSIS — G5700 Lesion of sciatic nerve, unspecified lower limb: Secondary | ICD-10-CM | POA: Diagnosis not present

## 2022-04-05 NOTE — Progress Notes (Signed)
   CC: sciatic nerve pain  HPI:  Brenda Powell is a 72 y.o. person with past medical history as detailed below who presents due to recent bilateral sciatic nerve pain. Please see problem based charting for detailed assessment and plan.  Past Medical History:  Diagnosis Date   B12 deficiency    Dry eye    HTN (hypertension)    Hyperlipidemia    Insomnia    MDD (major depressive disorder)    Review of Systems:  Negative unless otherwise stated.  Physical Exam:  Vitals:   04/05/22 1001  BP: 116/79  Pulse: 98  Resp: (!) 28  Temp: 98 F (36.7 C)  TempSrc: Oral  SpO2: 97%  Weight: 193 lb 11.2 oz (87.9 kg)  Height: 5\' 4"  (1.626 m)   Constitutional:Appears well, stated age. In no acute distress. Pulm:Normal work of breathing on room air. IOE:[id number] leg testing is negative bilaterally. No deformities or step-offs of lumbar vertebrae. No deformities appreciated around bilateral knee joints. Gait is normal. No hypertonia of lumbar paraspinal muscles or lateral thigh musculature. Neuro:Alert and oriented x3. No focal deficit noted. Psych:Pleasant mood and affect.   Assessment & Plan:   See Encounters Tab for problem based charting.  Piriformis syndrome Patient reports symptoms of L buttocks pain that radiated to her knee joint that began last week. The pain improved on the L after several days and then occurred on the R. She feels a burning, pulling sensation in the lateral knee when she experiences the pain. She alternated between heat and ice and is unsure if she received much relief from this. She took tylenol and ibuprofen together one day which helped but was afraid to do that for multiple days due to her hypertension. She then took 1000 mg tylenol TID which did ultimately help. She also states that she had one leftover tramadol from a prior carpal tunnel surgery which took care of her pain quickly when she took it. Adjusting her position while sitting had no affect on her  pain. She has not done physical therapy in the past. She has not had new issues with urinary retention, fecal or urinary incontinence, or saddle anesthesia; there is also no new significant motor deficit noted in the bilateral LE. There are no recent falls. Today she does not have the pain but was worried last week prompting her to make the appointment. Assessment:Physical exam overall unremarkable and patient denies active symptoms. Given location of her previous symptoms, she may be experiencing sciatic nerve irritation from piriformis syndrome rather than due to irritation where the sciatic nerve exits through L4-S3. I do not suspect acute injury needing imaging. Plan:Will refer patient to physical therapy. She can continue tylenol as needed for pain with infrequent ibuprofen for more severe pain. Continue heat and ice as well.  Patient discussed with Dr. Dareen Piano

## 2022-04-05 NOTE — Assessment & Plan Note (Addendum)
Patient reports symptoms of L buttocks pain that radiated to her knee joint that began last week. The pain improved on the L after several days and then occurred on the R. She feels a burning, pulling sensation in the lateral knee when she experiences the pain. She alternated between heat and ice and is unsure if she received much relief from this. She took tylenol and ibuprofen together one day which helped but was afraid to do that for multiple days due to her hypertension. She then took 1000 mg tylenol TID which did ultimately help. She also states that she had one leftover tramadol from a prior carpal tunnel surgery which took care of her pain quickly when she took it. Adjusting her position while sitting had no affect on her pain. She has not done physical therapy in the past. She has not had new issues with urinary retention, fecal or urinary incontinence, or saddle anesthesia; there is also no new significant motor deficit noted in the bilateral LE. There are no recent falls. Today she does not have the pain but was worried last week prompting her to make the appointment. Assessment:Physical exam overall unremarkable and patient denies active symptoms. Given location of her previous symptoms, she may be experiencing sciatic nerve irritation from piriformis syndrome rather than due to irritation where the sciatic nerve exits through L4-S3. I do not suspect acute injury needing imaging. Plan:Will refer patient to physical therapy. She can continue tylenol as needed for pain with infrequent ibuprofen for more severe pain. Continue heat and ice as well.

## 2022-04-05 NOTE — Patient Instructions (Signed)
Mrs. Nuss,  It was a pleasure to care for you today!  I'm sorry you have been having pain. I feel this could be related to either your sciatic nerve, or to one of your gluteal muscles called the piriformis muscle. Either way, I am going to have you work with physical therapy as part of your treatment and long-term management.  In the meantime, continue with heat, ice, and stretching. If you have moderate pain you can take tylenol, and for severe pain you can take ibuprofen as well for up to 3 days.  My best, Dr. Marlou Sa

## 2022-04-11 NOTE — Progress Notes (Signed)
Internal Medicine Clinic Attending ? ?Case discussed with Dr. Dean  At the time of the visit.  We reviewed the resident?s history and exam and pertinent patient test results.  I agree with the assessment, diagnosis, and plan of care documented in the resident?s note.  ?

## 2022-04-29 ENCOUNTER — Other Ambulatory Visit: Payer: Self-pay | Admitting: Internal Medicine

## 2022-04-29 ENCOUNTER — Ambulatory Visit (INDEPENDENT_AMBULATORY_CARE_PROVIDER_SITE_OTHER): Payer: Medicare Other | Admitting: Internal Medicine

## 2022-04-29 ENCOUNTER — Encounter: Payer: Self-pay | Admitting: Internal Medicine

## 2022-04-29 ENCOUNTER — Other Ambulatory Visit: Payer: Self-pay

## 2022-04-29 VITALS — BP 134/73 | HR 85 | Temp 97.7°F | Ht 64.0 in | Wt 199.3 lb

## 2022-04-29 DIAGNOSIS — E782 Mixed hyperlipidemia: Secondary | ICD-10-CM

## 2022-04-29 DIAGNOSIS — G5700 Lesion of sciatic nerve, unspecified lower limb: Secondary | ICD-10-CM | POA: Diagnosis not present

## 2022-04-29 DIAGNOSIS — F410 Panic disorder [episodic paroxysmal anxiety] without agoraphobia: Secondary | ICD-10-CM | POA: Diagnosis not present

## 2022-04-29 DIAGNOSIS — E6609 Other obesity due to excess calories: Secondary | ICD-10-CM

## 2022-04-29 DIAGNOSIS — I10 Essential (primary) hypertension: Secondary | ICD-10-CM

## 2022-04-29 DIAGNOSIS — Z6834 Body mass index (BMI) 34.0-34.9, adult: Secondary | ICD-10-CM

## 2022-04-29 DIAGNOSIS — K219 Gastro-esophageal reflux disease without esophagitis: Secondary | ICD-10-CM | POA: Diagnosis not present

## 2022-04-29 MED ORDER — WEGOVY 0.25 MG/0.5ML ~~LOC~~ SOAJ: 0.2500 mg | SUBCUTANEOUS | 0 refills | Status: DC

## 2022-04-29 NOTE — Assessment & Plan Note (Addendum)
Patient reports her pain is much improved from when she presented on 04/05/22 with 1-week history of L buttock pain that radiated to her knee and then experienced similar symptoms on R side as well. She reports that exercise and stretching at her regular [employer] classes that she began recently have been helping with improvement of pain and function. She has an upcoming PT appointment in February.  Assessment: Exam shows lower back with no tenderness to palpation. There is mild tenderness to palpation at left lateral posterior knee at tendon. Her symptoms are currently well-managed with exercise and stretching. Her upcoming appointment with PT should continue to help with pyriformis syndrome.   Plan -Continue exercise, stretching -Upcoming PT appointment -Continue Tylenol as needed for pain with ibuprofen occasionally as needed for more severe pain

## 2022-04-29 NOTE — Assessment & Plan Note (Signed)
Intermittent heartburn symptoms, not taking omeprazole regularly. She notes taking the PPI as needed before eating spicy foods. We will discuss H2 blockers and/or alternative antacids at f/u visit.

## 2022-04-29 NOTE — Assessment & Plan Note (Addendum)
Patient presents for follow-up of anxiety. GAD-7 with score of 1 today. She takes Trazodone and Zoloft daily and Buspar TID as needed. She typically only requires Buspar at night before bedtime and occasionally 1-2 other doses during the day. She reports her stress is overall well managed currently - joining and participating in routine classes at [facility] have helped her with this as well.  Plan -Continue Zoloft 100 mg and Trazodone 100 mg daily -Continue Buspar 10 mg TID as needed

## 2022-04-29 NOTE — Assessment & Plan Note (Addendum)
One of patient's main current health goals is weight loss. Her weight has remained stable with BMI around 34 for the past year and she would like to continue losing weight. She reports that her current diet consists of lean meat, salad, fruits, and fast food sometimes. She recently joined [facility] and has been enjoying routine classes there each week. She is interested in [facility] for helping her achieve her goals - will plan to order and see out-of-pocket cost for her. Will also place referral for personal trainer Prep program. Patient counseled on nutritional diet to include whole grain and healthy protein options. Patient declines bariatric evaluation and Healthy Weight and Wellness referral at this time.  Plan -Continue routine exercise classes -Referral for Prep program -Start [facility]  -Counseled on healthy nutrition and lifestyle with packet provided -Follow-up in 3 months

## 2022-04-29 NOTE — Patient Instructions (Addendum)
Brenda Powell,  It was great to see you in clinic today.  Below is what we discussed today:  Weight loss - We will order WeGovy (same mechanism as Ozempic) and see how much it will cost for you. We will also place a referral for personal trainer program (Prep program). We have provided a nutrition and lifestyle packet for you to go through that can help you achieve your goals. Piriformis syndrome - We are glad to hear that the pain is improving. Keep up the good work with [employer]. Your upcoming PT appointment will help with this as well Lab work today - electrolytes and cholesterol   It is a pleasure to be a part of your team, thank you for allowing [place] to be a part of your care,  Max and Dr. Saverio Danker  If you need medication refills please notify your pharmacy one week in advance and they will send [place] a request.    Please contact your pharmacy about scheduling the shingles (Shingrix) and updated COVID-19 vaccines.

## 2022-04-29 NOTE — Progress Notes (Addendum)
Subjective:   Patient ID: Brenda Powell female   DOB: December 15, 1950 72 y.o.   MRN: [id number]  HPI: Brenda Powell is a 72 y.o. woman with past medical history listed below who presents to [facility] for follow-up of anxiety and piriformis syndrome. She would also like to discuss weight loss goals today. Please see problem-based assessment and plan charting for further details.   Past Medical History:  Diagnosis Date   B12 deficiency    Dry eye    HTN (hypertension)    Hyperlipidemia    Insomnia    MDD (major depressive disorder)    Orthostatic hypotension 02/18/2021     Current Outpatient Medications  Medication Sig Dispense Refill   Semaglutide-Weight Management (WEGOVY) 0.25 MG/0.5ML SOAJ Inject 0.25 mg into the skin once a week. 2 mL 0   atorvastatin (LIPITOR) 80 MG tablet TAKE 1 TABLET BY MOUTH DAILY 90 tablet 3   busPIRone (BUSPAR) 10 MG tablet Take 1 tablet (10 mg total) by mouth 3 (three) times daily as needed. 90 tablet 0   cycloSPORINE (RESTASIS) 0.05 % ophthalmic emulsion 1 drop 2 (two) times daily.     fluticasone (FLONASE) 50 MCG/ACT nasal spray 1 SPRAY IN EACH NOSTRIL EVERY DAY 16 mL 2   losartan (COZAAR) 100 MG tablet TAKE 1 TABLET BY MOUTH EVERY DAY 90 tablet 3   omeprazole (PRILOSEC) 20 MG capsule TAKE 1 CAPSULE BY MOUTH EVERY DAY 30 capsule 1   sertraline (ZOLOFT) 100 MG tablet TAKE 1 TABLET BY MOUTH EVERY DAY 90 tablet 0   traZODone (DESYREL) 100 MG tablet TAKE 1 TABLET BY MOUTH EVERYDAY AT BEDTIME 90 tablet 0   vitamin B-12 (CYANOCOBALAMIN) 1000 MCG tablet TAKE 1 TABLET(1000 MCG) BY MOUTH DAILY 100 tablet 2   No current facility-administered medications for this visit.     Objective:   Physical Exam: Vitals:   04/29/22 0828  BP: 134/73  Pulse: 85  Temp: 97.7 F (36.5 C)  TempSrc: Oral  SpO2: 99%  Weight: 199 lb 4.8 oz (90.4 kg)  Height: 5\' 4"  (1.626 m)    Constitutional: well appearing, in no acute distress Cardiovascular: regular rate with  normal rhythm, no murmurs Pulmonary/Chest: normal work of breathing on room air, lungs clear to auscultation bilaterally Abdominal: soft, non-tender, non-distended, bowel sounds present MSK: no lower extremity edema. Lower back no tenderness to palpation. Mild tenderness to palpation at left lateral posterior knee at tendon Skin: warm and dry. Neurological: alert and answering questions appropriately. Psych: appropriate mood and affect   Assessment & Plan:   Panic disorder Patient presents for follow-up of anxiety. GAD-7 with score of 1 today. She takes Trazodone and Zoloft daily and Buspar TID as needed. She typically only requires Buspar at night before bedtime and occasionally 1-2 other doses during the day. She reports her stress is overall well managed currently - joining and participating in routine classes at [facility] have helped her with this as well.  Plan -Continue Zoloft 100 mg and Trazodone 100 mg daily -Continue Buspar 10 mg TID as needed  Essential hypertension BP well-controlled at 134/73 today. Patient reports no dizziness, chest pain, or chest pressure. She reports no issues with taking her BP medications.  Plan -Continue losartan 100 mg daily -Follow-up BMP obtained today  Elevated triglycerides with high cholesterol -Follow-up lipid panel obtained today -Continue Lipitor 80 mg daily  Piriformis syndrome Patient reports her pain is much improved from when she presented on 04/05/22 with 1-week history  of L buttock pain that radiated to her knee and then experienced similar symptoms on R side as well. She reports that exercise and stretching at her regular [employer] classes that she began recently have been helping with improvement of pain and function. She has an upcoming PT appointment in February.  Assessment: Exam shows lower back with no tenderness to palpation. There is mild tenderness to palpation at left lateral posterior knee at tendon. Her symptoms are currently  well-managed with exercise and stretching. Her upcoming appointment with PT should continue to help with pyriformis syndrome.   Plan -Continue exercise, stretching -Upcoming PT appointment -Continue Tylenol as needed for pain with ibuprofen occasionally as needed for more severe pain  Obesity One of patient's main current health goals is weight loss. Her weight has remained stable with BMI around 34 for the past year and she would like to continue losing weight. She reports that her current diet consists of lean meat, salad, fruits, and fast food sometimes. She recently joined [facility] and has been enjoying routine classes there each week. She is interested in [facility] for helping her achieve her goals - will plan to order and see out-of-pocket cost for her. Will also place referral for personal trainer Prep program. Patient counseled on nutritional diet to include whole grain and healthy protein options. Patient declines bariatric evaluation and Healthy Weight and Wellness referral at this time.  Plan -Continue routine exercise classes -Referral for Prep program -Start [facility]  -Counseled on healthy nutrition and lifestyle with packet provided -Follow-up in 3 months  Gastroesophageal reflux disease without esophagitis Intermittent heartburn symptoms, not taking omeprazole regularly. She notes taking the PPI as needed before eating spicy foods. We will discuss H2 blockers and/or alternative antacids at f/u visit.   Ketrick Matney (Max) Rudd, MS3 04/29/2022

## 2022-04-29 NOTE — Assessment & Plan Note (Signed)
-  Follow-up lipid panel obtained today -Continue Lipitor 80 mg daily

## 2022-04-29 NOTE — Progress Notes (Signed)
Attestation for Student Documentation:  I personally was present and performed or re-performed the history, physical exam and medical decision-making activities of this service and have verified that the service and findings are accurately documented in the student's note.   Discussed referral to integrated behavioral health for counseling/talk therapy, however anxiety is well managed at this time and Ms. Creason declines.   We spent a significant portion of today's visit discussing healthy weight loss and nutrition. Ms. Deguire is frustrated with trial of prior diets. 24-hr dietary recall includes protein shake or egg sandwich for breakfast, salad for dinner.Trying to include more vegetables, fruits, beans, and lean meats. Choosing multigrain bread. We discussed transitioning to a whole-food, plant-based diet which she states will not happen due to her husband's taste. I have provided her with several resources for including more fiber, whole grains, vegetables, fruits, and plant-based protein into their diet and encouraged her to do so. She is not currently interested in a referral to nutritionist, weight clinic, or bariatric surgeon. She had questions about liposuction which I do not have significant knowledge of risks/benefits to make any formal recommendation, however I strongly believe lifestyle change would be more beneficial for overall health. Ms. Allbaugh has been trying to incorporate more physical activity which I congratulated her on due to multiple benefits. She was open to referral to [place] and a referral has been placed. She was interested in discussing weight loss medications, specifically Ozempic, and we have reviewed that these medications are unlikely to be covered by insurance for weight loss. No history of diabetes. However, given obesity with comorbidities such as hypertension and hyperlipidemia, we have discussed sending the prescription to investigate cost. I will continue to  follow-up this issue at upcoming appointments.  Charise Killian, MD 04/29/2022, 10:24 AM

## 2022-04-29 NOTE — Assessment & Plan Note (Addendum)
BP well-controlled at 134/73 today. Patient reports no dizziness, chest pain, or chest pressure. She reports no issues with taking her BP medications.  Plan -Continue losartan 100 mg daily -Follow-up BMP obtained today

## 2022-04-29 NOTE — Progress Notes (Deleted)
   Established Patient Office Visit  Subjective   Patient ID: Brenda Powell, female    DOB: 1951/02/03  Age: 72 y.o. MRN: [id number]  No chief complaint on file.   HPI  {History (Optional):23778}  ROS    Objective:     There were no vitals taken for this visit. {Vitals History (Optional):23777}  Physical Exam   No results found for any visits on 04/29/22.  {Labs (Optional):23779}  The 10-year ASCVD risk score (Arnett DK, et al., 2019) is: 9.4%    Assessment & Plan:   Problem List Items Addressed This Visit       Cardiovascular and Mediastinum   Essential hypertension   Relevant Orders   BMP8+Anion Gap     Other   Elevated triglycerides with high cholesterol - Primary   Relevant Orders   Lipid Profile    No follow-ups on file.    Charise Killian, MD

## 2022-04-30 ENCOUNTER — Other Ambulatory Visit: Payer: Self-pay | Admitting: Internal Medicine

## 2022-04-30 DIAGNOSIS — G4700 Insomnia, unspecified: Secondary | ICD-10-CM

## 2022-04-30 DIAGNOSIS — F410 Panic disorder [episodic paroxysmal anxiety] without agoraphobia: Secondary | ICD-10-CM

## 2022-04-30 DIAGNOSIS — E538 Deficiency of other specified B group vitamins: Secondary | ICD-10-CM

## 2022-04-30 LAB — BMP8+ANION GAP
Anion Gap: 12 mmol/L (ref 10.0–18.0)
BUN/Creatinine Ratio: 19 (ref 12–28)
BUN: 20 mg/dL (ref 8–27)
CO2: 21 mmol/L (ref 20–29)
Calcium: 8.9 mg/dL (ref 8.7–10.3)
Chloride: 103 mmol/L (ref 96–106)
Creatinine, Ser: 1.05 mg/dL — ABNORMAL HIGH (ref 0.57–1.00)
Glucose: 94 mg/dL (ref 70–99)
Potassium: 4.3 mmol/L (ref 3.5–5.2)
Sodium: 136 mmol/L (ref 134–144)
eGFR: 57 mL/min/{1.73_m2} — ABNORMAL LOW (ref >59)

## 2022-04-30 LAB — LIPID PANEL
Chol/HDL Ratio: 2.3 ratio (ref 0.0–4.4)
Cholesterol, Total: 156 mg/dL (ref 100–199)
HDL: 67 mg/dL (ref >39)
LDL Chol Calc (NIH): 73 mg/dL (ref 0–99)
Triglycerides: 85 mg/dL (ref 0–149)
VLDL Cholesterol Cal: 16 mg/dL (ref 5–40)

## 2022-04-30 NOTE — Progress Notes (Signed)
Lipids are at goal for primary prevention.

## 2022-04-30 NOTE — Addendum Note (Signed)
Addended by: Charise Killian on: 04/30/2022 08:52 AM   Modules accepted: Orders

## 2022-04-30 NOTE — Progress Notes (Signed)
Reviewed lab results with Ms. Brenda Powell. Recommend continuing atorvastatin. Will follow kidney function. Mancel Parsons was not covered by insurance, she would like to call the pharmacy to see how much it would be out of pocket. Alternative GLP-1 for weight loss also do not appear to be covered on her insurance plan. We discussed referral to health weight and wellness clinic for further discussion and she is amenable.

## 2022-04-30 NOTE — Progress Notes (Signed)
Very minimal reduction in eGFR, similar to values in 2021. Electrolytes wnl.

## 2022-05-02 ENCOUNTER — Telehealth: Payer: Self-pay

## 2022-05-02 NOTE — Telephone Encounter (Signed)
Alternative GLP-1 for weight loss not on formulary. Referred to Health weight and wellness for further lifestyle and medication counseling.

## 2022-05-02 NOTE — Telephone Encounter (Signed)
Called re: PREP program referral, she is unable to talk at length, will return my call Friday.

## 2022-05-07 ENCOUNTER — Telehealth: Payer: Self-pay

## 2022-05-07 NOTE — Telephone Encounter (Signed)
Called and explained PREP program; would like to attend at Brenda Powell, prefers M/W class, advised next one will start in April; will contact her in March with dates for April classes on M/W

## 2022-05-08 DIAGNOSIS — M25552 Pain in left hip: Secondary | ICD-10-CM | POA: Diagnosis not present

## 2022-05-13 ENCOUNTER — Encounter: Payer: Medicare Other | Admitting: Internal Medicine

## 2022-05-15 DIAGNOSIS — M25552 Pain in left hip: Secondary | ICD-10-CM | POA: Diagnosis not present

## 2022-05-22 ENCOUNTER — Ambulatory Visit (INDEPENDENT_AMBULATORY_CARE_PROVIDER_SITE_OTHER): Payer: Medicare Other

## 2022-05-22 ENCOUNTER — Ambulatory Visit (INDEPENDENT_AMBULATORY_CARE_PROVIDER_SITE_OTHER): Payer: Medicare Other | Admitting: Student

## 2022-05-22 ENCOUNTER — Ambulatory Visit (HOSPITAL_COMMUNITY)
Admission: RE | Admit: 2022-05-22 | Discharge: 2022-05-22 | Disposition: A | Discharge status: Home / Self Care | Payer: Medicare Other | Source: Ambulatory Visit | Attending: Internal Medicine | Admitting: Internal Medicine

## 2022-05-22 ENCOUNTER — Other Ambulatory Visit: Payer: Self-pay

## 2022-05-22 ENCOUNTER — Encounter: Payer: Self-pay | Admitting: Student

## 2022-05-22 VITALS — BP 137/80 | HR 86 | Ht 64.0 in | Wt 202.3 lb

## 2022-05-22 VITALS — BP 137/81 | HR 86 | Ht 64.0 in | Wt 202.3 lb

## 2022-05-22 DIAGNOSIS — Z01818 Encounter for other preprocedural examination: Secondary | ICD-10-CM

## 2022-05-22 DIAGNOSIS — R079 Chest pain, unspecified: Secondary | ICD-10-CM | POA: Diagnosis not present

## 2022-05-22 DIAGNOSIS — Z0000 Encounter for general adult medical examination without abnormal findings: Secondary | ICD-10-CM

## 2022-05-22 HISTORY — DX: Encounter for other preprocedural examination: Z01.818

## 2022-05-22 NOTE — Progress Notes (Signed)
Subjective:   Brenda Powell is a 72 y.o. female who presents for an Initial Medicare Annual Wellness Visit. I connected with  Dollene Laperriere on 05/22/22 by a  Face-To-Face  enabled telemedicine application and verified that I am speaking with the correct person using two identifiers.  Patient Location: Other:  Office/Clinic  Provider Location: Office/Clinic  I discussed the limitations of evaluation and management by telemedicine. The patient expressed understanding and agreed to proceed.  Review of Systems    Defer to PCP       Objective:    Today's Vitals   05/22/22 1225 05/22/22 1226 05/22/22 1227  BP: (!) 150/99 (!) 167/120 137/80  Pulse: 96 84 86  SpO2: 100%    Weight: 202 lb 4.8 oz (91.8 kg)    Height: '5\' 4"'$  (1.626 m)     Body mass index is 34.72 kg/m.     05/22/2022   12:26 PM 05/22/2022   10:15 AM 04/29/2022    8:31 AM 04/05/2022   10:00 AM 06/06/2021    2:39 PM 03/19/2021    2:00 PM 02/15/2021    3:06 PM  Advanced Directives  Does Patient Have a Medical Advance Directive? Yes Yes Yes Yes Yes Yes Yes  Type of Paramedic of [place];Living will [place];Living will [place];Living will [place];Living will [place];Living will Living will Living will  Does patient want to make changes to medical advance directive?   No - Patient declined No - Patient declined No - Patient declined No - Patient declined No - Guardian declined  Copy of [place] in Chart? Yes - validated most recent copy scanned in chart (See row information) Yes - validated most recent copy scanned in chart (See row information) Yes - validated most recent copy scanned in chart (See row information) Yes - validated most recent copy scanned in chart (See row information) Yes - validated most recent copy scanned in chart (See row information)    Would patient like information on  creating a medical advance directive? No - Patient declined          Current Medications (verified) Outpatient Encounter Medications as of 05/22/2022  Medication Sig   atorvastatin (LIPITOR) 80 MG tablet TAKE 1 TABLET BY MOUTH DAILY   busPIRone (BUSPAR) 10 MG tablet Take 1 tablet (10 mg total) by mouth 3 (three) times daily as needed.   cyanocobalamin ([employer] VITAMIN B12) 1000 MCG tablet TAKE 1 TABLET(1000 MCG) BY MOUTH DAILY   cycloSPORINE (RESTASIS) 0.05 % ophthalmic emulsion 1 drop 2 (two) times daily.   fluticasone (FLONASE) 50 MCG/ACT nasal spray 1 SPRAY IN EACH NOSTRIL EVERY DAY   losartan (COZAAR) 100 MG tablet TAKE 1 TABLET BY MOUTH EVERY DAY   omeprazole (PRILOSEC) 20 MG capsule TAKE 1 CAPSULE BY MOUTH EVERY DAY   Semaglutide-Weight Management (WEGOVY) 0.25 MG/0.5ML SOAJ Inject 0.25 mg into the skin once a week.   sertraline (ZOLOFT) 100 MG tablet TAKE 1 TABLET BY MOUTH EVERY DAY   traZODone (DESYREL) 100 MG tablet TAKE 1 TABLET BY MOUTH EVERYDAY AT BEDTIME   No facility-administered encounter medications on file as of 05/22/2022.    Allergies (verified) Patient has no known allergies.   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   Drug use: Never   Sexual activity: Yes    Partners: Male  Other Topics Concern   Not on file  Social History Narrative   Not on file   Social Determinants of Health   Financial Resource Strain: Low Risk  (05/22/2022)   Overall Financial Resource Strain (CARDIA)    Difficulty of Paying Living Expenses: Not hard at all  Food Insecurity: No Food Insecurity (05/22/2022)   Hunger Vital Sign    Worried About Running Out of Food in the Last Year: Never true    Ran Out of Food in the Last Year: Never true  Transportation Needs: No Transportation Needs (05/22/2022)   PRAPARE - Hydrologist (Medical): No    Lack of Transportation (Non-Medical): No  Physical Activity: Sufficiently Active (05/22/2022)   Exercise Vital Sign    Days of Exercise per Week: 4 days    Minutes of Exercise per Session: 50 min  Stress: No Stress Concern Present (05/22/2022)   [place]    Feeling of Stress : Not at all  Social Connections: Unknown (05/22/2022)   Social Connection and Isolation Panel [NHANES]    Frequency of Communication with Friends and Family: More than three times a week    Frequency of Social Gatherings with Friends and Family: Once a week    Attends Religious Services: Patient refused    Marine scientist or Organizations: Yes    Attends Archivist Meetings: 1 to 4 times per year    Marital Status: Married  Recent Concern: Social Connections - Moderately Isolated (04/05/2022)   Social Connection and Isolation Panel [NHANES]    Frequency of Communication with Friends and Family: More than three times a week    Frequency of Social Gatherings with Friends and Family: More than three times a week    Attends Religious Services: Never     Marine scientist or Organizations: No    Attends Music therapist: Never    Marital Status: Married    Tobacco Counseling Counseling given: Not Answered   Clinical Intake:  Pre-visit preparation completed: Yes  Pain : No/denies pain     Nutritional Risks: None Diabetes: No  How often do you need to have someone help you when you read instructions, pamphlets, or other written materials from your doctor or pharmacy?: 1 - Never What is the last grade level you completed in school?: college  Diabetic?No  Interpreter Needed?: No  Information entered by ::  Corey Skains Jaquane Boughner,cma 05/22/22 12:26am   Activities of Daily Living    05/22/2022   12:27 PM 05/22/2022   10:15 AM  In your present state of health, do you have any difficulty performing the following activities:  Hearing? 0 0  Vision? 0 0  Difficulty concentrating or making decisions? 0 0  Walking or climbing stairs? 0 0  Dressing or bathing? 0 0  Doing errands, shopping? 0 0    Patient Care Team: Charise Killian, MD as PCP - General (Internal Medicine)  Indicate any recent Medical Services you may have received from other than [facility] providers in the past year (date may be approximate).     Assessment:   This is a routine wellness examination for Jalei.  Hearing/Vision screen No results found.  Dietary issues and exercise activities discussed:     Goals Addressed   None   Depression Screen    05/22/2022   12:27 PM 05/22/2022   10:15 AM 04/29/2022    8:30 AM 04/05/2022   10:06 AM 06/06/2021    2:42 PM 03/19/2021    2:04 PM 02/15/2021    3:08 PM  PHQ 2/9 Scores  PHQ - 2 Score 0 0 0 0 0 0 0  PHQ- 9 Score 0 0  0  3 0    Fall Risk    05/22/2022   12:26 PM 05/22/2022   10:15 AM 04/29/2022    8:30 AM 04/05/2022   10:00 AM 06/06/2021    2:36 PM  Fall Risk   Falls in the past year? 0 0 0 0 1  Number falls in past yr: 0 0 0 0 0  Injury with Fall? 0 0 0 0 1  Risk for fall due to : No Fall Risks No Fall  Risks No Fall Risks No Fall Risks   Follow up Falls evaluation completed;Falls prevention discussed Falls evaluation completed;Falls prevention discussed Falls evaluation completed;Falls prevention discussed Falls evaluation completed;Falls prevention discussed Falls evaluation completed    FALL RISK PREVENTION PERTAINING TO THE HOME:  Any stairs in or around the home? Yes  If so, are there any without handrails? No  Home free of loose throw rugs in walkways, pet beds, electrical cords, etc? Yes  Adequate lighting in your home to reduce risk of falls? Yes   ASSISTIVE DEVICES UTILIZED TO PREVENT FALLS:  Life alert? No  Use of a cane, walker or w/c? No  Grab bars in the bathroom? Yes  Shower chair or bench in shower? Yes  Elevated toilet seat or a handicapped toilet? Yes   TIMED UP AND GO:  Was the test performed? Yes .  Length of time to ambulate 10 feet: 1 min.   Gait steady and fast without use of assistive device  Cognitive Function:        05/22/2022   12:27 PM  6CIT Screen  What Year? 0 points  What month? 0 points  What time? 0 points  Count back from 20 0 points  Months in reverse 0 points  Repeat phrase 0 points  Total Score 0 points    Immunizations Immunization History  Administered Date(s) Administered   Fluad Quad(high Dose 65+) 12/24/2021   Influenza Split 02/02/2018   Influenza, High Dose Seasonal PF 12/17/2018   Influenza,inj,Quad PF,6+ Mos 12/16/2019   Moderna Sars-Covid-2 Vaccination 05/06/2019, 06/10/2019, 03/02/2020   Pneumococcal Conjugate-13 02/17/2018   Pneumococcal Polysaccharide-23 05/22/2020   Tdap 12/31/2012   Zoster, Live 10/20/2012    TDAP status: Up to date  Flu Vaccine status: Up to date  Pneumococcal vaccine status: Up to date  Covid-19 vaccine status: Completed vaccines  Qualifies for Shingles Vaccine? No   Zostavax completed No   Shingrix Completed?: No.    Education has been provided regarding the importance of this  vaccine. Patient has been advised to call insurance company to determine out of pocket expense if they have not yet received this vaccine. Advised may also receive vaccine at local pharmacy or Health Dept. Verbalized acceptance and understanding.  Screening Tests Health Maintenance  Topic Date Due   Zoster Vaccines- Shingrix (1 of 2) Never done   COVID-19 Vaccine (4 - 2023-24 season) 11/30/2021   MAMMOGRAM  06/30/2022   DTaP/Tdap/Td (2 - Td or Tdap) 01/01/2023   Medicare Annual Wellness (AWV)  05/23/2023   COLONOSCOPY (Pts 45-35yr Insurance coverage will need to be confirmed)  07/10/2027   Pneumonia Vaccine 72 Years old  Completed   INFLUENZA VACCINE  Completed   DEXA SCAN  Completed   Hepatitis C Screening  Completed   HPV VACCINES  Aged Out    Health Maintenance  Health Maintenance Due  Topic Date Due   Zoster Vaccines- Shingrix (1 of 2) Never done   COVID-19 Vaccine (4 - 2023-24 season) 11/30/2021    Colorectal cancer screening: Type of screening: Colonoscopy. Completed 07/09/2017. Repeat every 10 years  Mammogram status: Completed 06/29/2020. Repeat every year:2   Lung Cancer Screening: (Low Dose CT Chest recommended if Age 71-80years, 30 pack-year currently smoking OR have quit w/in 15years.) does not qualify.   Lung Cancer Screening Referral: N/A  Additional Screening:  Hepatitis C Screening: does not qualify; Completed 09/09/2019  Vision Screening: Recommended annual ophthalmology exams for early detection of glaucoma and other disorders of the eye. Is the patient up to date with their annual eye exam?  Yes  Who is the provider or what is the name of the office in which the patient attends annual eye exams? [facility] [place] If pt is not established with a provider, would they like to be referred to a provider to establish care? No .   Dental Screening: Recommended annual dental exams for proper oral hygiene  Community Resource Referral / Chronic Care  Management: CRR required this visit?  No   CCM required this visit?  No      Plan:     I have personally reviewed and noted the following in the patient's chart:   Medical and social history Use of alcohol, tobacco or illicit drugs  Current medications and supplements including opioid prescriptions. Patient is not currently taking opioid prescriptions. Functional ability and status Nutritional status Physical activity Advanced directives List of other physicians Hospitalizations, surgeries, and ER visits in previous 12 months Vitals Screenings to include cognitive, depression, and falls Referrals and appointments  In addition, I have reviewed and discussed with patient certain preventive protocols, quality metrics, and best practice recommendations. A written personalized care plan for preventive services as well as general preventive health recommendations were provided to patient.     JKerin Perna CSan Pedro  05/22/2022   Nurse Notes: Face-To-Face Visit  Ms. GRittenberg, Thank you for taking time to come for your Medicare Wellness Visit. I appreciate your ongoing commitment to your health goals. Please review the following plan we discussed and let me know if I can assist you in the future.   These are the goals we discussed:  Goals   None     This  is a list of the screening recommended for you and due dates:  Health Maintenance  Topic Date Due   Zoster (Shingles) Vaccine (1 of 2) Never done   COVID-19 Vaccine (4 - 2023-24 season) 11/30/2021   Mammogram  06/30/2022   DTaP/Tdap/Td vaccine (2 - Td or Tdap) 01/01/2023   Medicare Annual Wellness Visit  05/23/2023   Colon Cancer Screening  07/10/2027   Pneumonia Vaccine  Completed   Flu Shot  Completed   DEXA scan (bone density measurement)  Completed   Hepatitis C Screening: USPSTF Recommendation to screen - Ages 19-79 yo.  Completed   HPV Vaccine  Aged Out

## 2022-05-22 NOTE — Patient Instructions (Signed)
Thank you so much for coming to the clinic today!   Good luck with your procedure, we are checking your EKG and labs today. I will call you when the results are back if there is anything abnormal.   If you have any questions please feel free to the call the clinic at anytime at [phone]. It was a pleasure seeing you!  Best, Dr. Sanjuana Mae

## 2022-05-22 NOTE — Assessment & Plan Note (Signed)
Patient presents for medical clearance for liposuction.  She will be getting procedure done by surgeon in [place] from [place] [facility].  They requested a CBC, CMP, and EKG.  EKG shows 1 PVC, however she is in sinus rhythm.  She denies any symptoms of being sick recently, chest pain, shortness of breath, fevers, chills, nausea, vomiting, and diarrhea.  Patient's blood pressure on recheck was 137/81.  We will obtain a CBC and CMP order to establish medical clearance, and will inform patient of the results as soon as they are available.  Plan: - Follow-up with CBC, CMP

## 2022-05-22 NOTE — Progress Notes (Signed)
CC: Preop medical clearance  HPI:  [name] is a 72 y.o. female living with a history stated below and presents today for preop medical clearance. Please see problem based assessment and plan for additional details.  Past Medical History:  Diagnosis Date   B12 deficiency    Dry eye    HTN (hypertension)    Hyperlipidemia    Insomnia    MDD (major depressive disorder)    Orthostatic hypotension 02/18/2021    Current Outpatient Medications on File Prior to Visit  Medication Sig Dispense Refill   atorvastatin (LIPITOR) 80 MG tablet TAKE 1 TABLET BY MOUTH DAILY 90 tablet 3   busPIRone (BUSPAR) 10 MG tablet Take 1 tablet (10 mg total) by mouth 3 (three) times daily as needed. 90 tablet 0   cyanocobalamin ([employer] VITAMIN B12) 1000 MCG tablet TAKE 1 TABLET(1000 MCG) BY MOUTH DAILY 100 tablet 2   cycloSPORINE (RESTASIS) 0.05 % ophthalmic emulsion 1 drop 2 (two) times daily.     fluticasone (FLONASE) 50 MCG/ACT nasal spray 1 SPRAY IN EACH NOSTRIL EVERY DAY 16 mL 2   losartan (COZAAR) 100 MG tablet TAKE 1 TABLET BY MOUTH EVERY DAY 90 tablet 3   omeprazole (PRILOSEC) 20 MG capsule TAKE 1 CAPSULE BY MOUTH EVERY DAY 30 capsule 1   Semaglutide-Weight Management (WEGOVY) 0.25 MG/0.5ML SOAJ Inject 0.25 mg into the skin once a week. 2 mL 0   sertraline (ZOLOFT) 100 MG tablet TAKE 1 TABLET BY MOUTH EVERY DAY 90 tablet 3   traZODone (DESYREL) 100 MG tablet TAKE 1 TABLET BY MOUTH EVERYDAY AT BEDTIME 90 tablet 3   No current facility-administered medications on file prior to visit.    Family History  Problem Relation Age of Onset   Congestive Heart Failure Father     Social History   Socioeconomic History   Marital status: Married    Spouse name: Not on file   Number of children: Not on file   Years of education: Not on file   Highest education level: Not on file  Occupational History   Not on file  Tobacco Use   Smoking status: Never   Smokeless tobacco: Never  Substance and  Sexual Activity   Alcohol use: Yes    Alcohol/week: 7.0 standard drinks of alcohol    Types: 7 Glasses of wine per week    Comment: 3 -4 times a week.   Drug use: Never   Sexual activity: Yes    Partners: Male  Other Topics Concern   Not on file  Social History Narrative   Not on file   Social Determinants of Health   Financial Resource Strain: Low Risk  (05/22/2022)   Overall Financial Resource Strain (CARDIA)    Difficulty of Paying Living Expenses: Not hard at all  Food Insecurity: No Food Insecurity (05/22/2022)   Hunger Vital Sign    Worried About Running Out of Food in the Last Year: Never true    Ran Out of Food in the Last Year: Never true  Transportation Needs: No Transportation Needs (05/22/2022)   PRAPARE - Hydrologist (Medical): No    Lack of Transportation (Non-Medical): No  Physical Activity: Sufficiently Active (05/22/2022)   Exercise Vital Sign    Days of Exercise per Week: 4 days    Minutes of Exercise per Session: 50 min  Stress: No Stress Concern Present (05/22/2022)   [address]  Feeling of Stress : Not at all  Social Connections: Unknown (05/22/2022)   Social Connection and Isolation Panel [NHANES]    Frequency of Communication with Friends and Family: More than three times a week    Frequency of Social Gatherings with Friends and Family: Once a week    Attends Religious Services: Patient refused    Marine scientist or Organizations: Yes    Attends Archivist Meetings: 1 to 4 times per year    Marital Status: Married  Recent Concern: Social Connections - Moderately Isolated (04/05/2022)   Social Connection and Isolation Panel [NHANES]    Frequency of Communication with Friends and Family: More than three times a week    Frequency of Social Gatherings with Friends and Family: More than three times a week    Attends Religious Services: Never     Marine scientist or Organizations: No    Attends Archivist Meetings: Never    Marital Status: Married  Human resources officer Violence: Not At Risk (05/22/2022)   Humiliation, Afraid, Rape, and Kick questionnaire    Fear of Current or Ex-Partner: No    Emotionally Abused: No    Physically Abused: No    Sexually Abused: No    Review of Systems: ROS negative except for what is noted on the assessment and plan.  Vitals:   05/22/22 1014 05/22/22 1104 05/22/22 1121  BP: (!) 150/99 (!) 167/120 137/81  Pulse: 96 84 86  SpO2: 100%    Weight: 202 lb 4.8 oz (91.8 kg)    Height: 5' 4"$  (1.626 m)      Physical Exam: Constitutional: well-appearing female sitting in chair, in no acute distress HENT: normocephalic atraumatic, mucous membranes moist Eyes: conjunctiva non-erythematous Neck: supple Cardiovascular: regular rate and rhythm, no m/r/g Pulmonary/Chest: normal work of breathing on room air, lungs clear to auscultation bilaterally Abdominal: soft, non-tender, non-distended MSK: normal bulk and tone Neurological: alert & oriented x 3, 5/5 strength in bilateral upper and lower extremities, normal gait Skin: warm and dry Psych: Normal mood and affect  Assessment & Plan:   Preoperative clearance Patient presents for medical clearance for liposuction.  She will be getting procedure done by surgeon in [place] from [place] [facility].  They requested a CBC, CMP, and EKG.  EKG shows 1 PVC, however she is in sinus rhythm.  She denies any symptoms of being sick recently, chest pain, shortness of breath, fevers, chills, nausea, vomiting, and diarrhea.  Patient's blood pressure on recheck was 137/81.  We will obtain a CBC and CMP order to establish medical clearance, and will inform patient of the results as soon as they are available.  Plan: - Follow-up with CBC, CMP  Patient discussed with Dr. Thomasene Ripple, M.D. [facility], PGY-1 Phone:  [phone] Date 05/22/2022 Time 3:51 PM

## 2022-05-23 DIAGNOSIS — M25552 Pain in left hip: Secondary | ICD-10-CM | POA: Diagnosis not present

## 2022-05-23 LAB — CBC
Hematocrit: 39.2 % (ref 34.0–46.6)
Hemoglobin: 13.1 g/dL (ref 11.1–15.9)
MCH: 34.1 pg — ABNORMAL HIGH (ref 26.6–33.0)
MCHC: 33.4 g/dL (ref 31.5–35.7)
MCV: 102 fL — ABNORMAL HIGH (ref 79–97)
Platelets: 272 10*3/uL (ref 150–450)
RBC: 3.84 x10E6/uL (ref 3.77–5.28)
RDW: 12.2 % (ref 11.7–15.4)
WBC: 5 10*3/uL (ref 3.4–10.8)

## 2022-05-23 LAB — CMP14 + ANION GAP
ALT: 32 IU/L (ref 0–32)
AST: 32 IU/L (ref 0–40)
Albumin/Globulin Ratio: 2 (ref 1.2–2.2)
Albumin: 4.1 g/dL (ref 3.9–4.9)
Alkaline Phosphatase: 64 IU/L (ref 44–121)
Anion Gap: 17 mmol/L (ref 10.0–18.0)
BUN/Creatinine Ratio: 22 (ref 12–28)
BUN: 23 mg/dL (ref 8–27)
Bilirubin Total: 0.4 mg/dL (ref 0.0–1.2)
CO2: 20 mmol/L (ref 20–29)
Calcium: 9 mg/dL (ref 8.7–10.3)
Chloride: 104 mmol/L (ref 96–106)
Creatinine, Ser: 1.03 mg/dL — ABNORMAL HIGH (ref 0.57–1.00)
Globulin, Total: 2.1 g/dL (ref 1.5–4.5)
Glucose: 102 mg/dL — ABNORMAL HIGH (ref 70–99)
Potassium: 4.4 mmol/L (ref 3.5–5.2)
Sodium: 141 mmol/L (ref 134–144)
Total Protein: 6.2 g/dL (ref 6.0–8.5)
eGFR: 58 mL/min/{1.73_m2} — ABNORMAL LOW (ref >59)

## 2022-05-24 NOTE — Progress Notes (Signed)
Internal Medicine Clinic Attending  Case discussed with the resident at the time of the visit.  We reviewed the resident's history and exam and pertinent patient test results.  I agree with the assessment, diagnosis, and plan of care documented in the resident's note.  

## 2022-05-29 DIAGNOSIS — M25552 Pain in left hip: Secondary | ICD-10-CM | POA: Diagnosis not present

## 2022-05-31 NOTE — Progress Notes (Signed)
Internal Medicine Clinic Attending  Case and documentation reviewed.  I reviewed the AWV findings.  I agree with the assessment, diagnosis, and plan of care documented in the AWV note.    Gilles Chiquito, MD

## 2022-06-28 ENCOUNTER — Telehealth: Payer: Self-pay

## 2022-06-28 NOTE — Telephone Encounter (Signed)
Called to discuss April PREP schedule at [facility], she would like to attend April 29 3:30 class on M/W; will call back mid-April to set up assessment visit.

## 2022-07-15 ENCOUNTER — Telehealth: Payer: Self-pay

## 2022-07-15 NOTE — Telephone Encounter (Signed)
Called to confirm participation in next PREP Class at [facility] on April 29; assessment visit scheduled for 4/24 at 2pm

## 2022-07-23 DIAGNOSIS — J029 Acute pharyngitis, unspecified: Secondary | ICD-10-CM | POA: Diagnosis not present

## 2022-07-23 DIAGNOSIS — J019 Acute sinusitis, unspecified: Secondary | ICD-10-CM | POA: Diagnosis not present

## 2022-07-23 DIAGNOSIS — Z03818 Encounter for observation for suspected exposure to other biological agents ruled out: Secondary | ICD-10-CM | POA: Diagnosis not present

## 2022-07-23 DIAGNOSIS — H9203 Otalgia, bilateral: Secondary | ICD-10-CM | POA: Diagnosis not present

## 2022-07-29 ENCOUNTER — Encounter: Payer: Medicare Other | Admitting: Internal Medicine

## 2022-07-29 ENCOUNTER — Ambulatory Visit: Payer: Medicare Other

## 2022-08-08 DIAGNOSIS — H179 Unspecified corneal scar and opacity: Secondary | ICD-10-CM | POA: Diagnosis not present

## 2022-08-08 DIAGNOSIS — H25012 Cortical age-related cataract, left eye: Secondary | ICD-10-CM | POA: Diagnosis not present

## 2022-08-08 DIAGNOSIS — H2513 Age-related nuclear cataract, bilateral: Secondary | ICD-10-CM | POA: Diagnosis not present

## 2022-08-15 DIAGNOSIS — M65342 Trigger finger, left ring finger: Secondary | ICD-10-CM | POA: Diagnosis not present

## 2022-08-31 ENCOUNTER — Other Ambulatory Visit: Payer: Self-pay | Admitting: Internal Medicine

## 2022-08-31 DIAGNOSIS — G4700 Insomnia, unspecified: Secondary | ICD-10-CM

## 2022-10-02 ENCOUNTER — Other Ambulatory Visit: Payer: Self-pay | Admitting: Internal Medicine

## 2022-10-02 DIAGNOSIS — G4700 Insomnia, unspecified: Secondary | ICD-10-CM

## 2022-10-30 ENCOUNTER — Other Ambulatory Visit: Payer: Self-pay | Admitting: Internal Medicine

## 2022-10-30 DIAGNOSIS — G4700 Insomnia, unspecified: Secondary | ICD-10-CM

## 2022-10-30 DIAGNOSIS — F410 Panic disorder [episodic paroxysmal anxiety] without agoraphobia: Secondary | ICD-10-CM

## 2022-11-10 DIAGNOSIS — U071 COVID-19: Secondary | ICD-10-CM | POA: Diagnosis not present

## 2022-11-10 DIAGNOSIS — R051 Acute cough: Secondary | ICD-10-CM | POA: Diagnosis not present

## 2022-11-14 ENCOUNTER — Telehealth: Payer: Self-pay

## 2022-11-14 NOTE — Telephone Encounter (Signed)
Patient called she is requesting a referral for a mammogram.

## 2022-11-15 ENCOUNTER — Other Ambulatory Visit: Payer: Self-pay | Admitting: Internal Medicine

## 2022-11-15 DIAGNOSIS — Z1231 Encounter for screening mammogram for malignant neoplasm of breast: Secondary | ICD-10-CM

## 2022-11-15 NOTE — Telephone Encounter (Signed)
Referral placed. Thank you

## 2022-11-29 ENCOUNTER — Ambulatory Visit: Payer: Medicare Other

## 2022-11-29 ENCOUNTER — Other Ambulatory Visit: Payer: Self-pay | Admitting: Internal Medicine

## 2022-11-29 DIAGNOSIS — G4700 Insomnia, unspecified: Secondary | ICD-10-CM

## 2022-12-12 ENCOUNTER — Ambulatory Visit
Admission: RE | Admit: 2022-12-12 | Discharge: 2022-12-12 | Disposition: A | Discharge status: Home / Self Care | Payer: Medicare Other | Source: Ambulatory Visit | Attending: Internal Medicine | Admitting: Internal Medicine

## 2022-12-12 DIAGNOSIS — Z1231 Encounter for screening mammogram for malignant neoplasm of breast: Secondary | ICD-10-CM | POA: Diagnosis not present

## 2022-12-13 DIAGNOSIS — Z23 Encounter for immunization: Secondary | ICD-10-CM | POA: Diagnosis not present

## 2022-12-24 DIAGNOSIS — M65342 Trigger finger, left ring finger: Secondary | ICD-10-CM | POA: Diagnosis not present

## 2022-12-26 ENCOUNTER — Other Ambulatory Visit: Payer: Self-pay | Admitting: Internal Medicine

## 2022-12-26 DIAGNOSIS — F410 Panic disorder [episodic paroxysmal anxiety] without agoraphobia: Secondary | ICD-10-CM

## 2023-01-26 ENCOUNTER — Other Ambulatory Visit: Payer: Self-pay | Admitting: Internal Medicine

## 2023-02-17 ENCOUNTER — Ambulatory Visit: Payer: Medicare Other | Admitting: Student

## 2023-02-17 ENCOUNTER — Encounter: Payer: Self-pay | Admitting: Student

## 2023-02-17 VITALS — BP 146/88 | HR 89 | Temp 98.2°F | Ht 64.0 in | Wt 185.7 lb

## 2023-02-17 DIAGNOSIS — I10 Essential (primary) hypertension: Secondary | ICD-10-CM | POA: Diagnosis not present

## 2023-02-17 DIAGNOSIS — M1990 Unspecified osteoarthritis, unspecified site: Secondary | ICD-10-CM

## 2023-02-17 DIAGNOSIS — Z6831 Body mass index (BMI) 31.0-31.9, adult: Secondary | ICD-10-CM

## 2023-02-17 DIAGNOSIS — M19041 Primary osteoarthritis, right hand: Secondary | ICD-10-CM | POA: Diagnosis not present

## 2023-02-17 DIAGNOSIS — E6609 Other obesity due to excess calories: Secondary | ICD-10-CM

## 2023-02-17 DIAGNOSIS — E669 Obesity, unspecified: Secondary | ICD-10-CM | POA: Diagnosis not present

## 2023-02-17 DIAGNOSIS — M19042 Primary osteoarthritis, left hand: Secondary | ICD-10-CM | POA: Diagnosis not present

## 2023-02-17 DIAGNOSIS — F410 Panic disorder [episodic paroxysmal anxiety] without agoraphobia: Secondary | ICD-10-CM

## 2023-02-17 MED ORDER — DICLOFENAC SODIUM 1 % EX GEL
4.0000 g | CUTANEOUS | Status: DC | PRN
Start: 2023-02-17 — End: 2023-11-25

## 2023-02-17 NOTE — Progress Notes (Cosign Needed Addendum)
CC: Follow-up, general health exam.  HPI:  Brenda Powell is a 72 y.o. female living with a history stated below and presents today for follow-up.  She has no acute complaints. Please see problem based assessment and plan for additional details.  Past Medical History:  Diagnosis Date   B12 deficiency    Dry eye    HTN (hypertension)    Hyperlipidemia    Insomnia    MDD (major depressive disorder)    Orthostatic hypotension 02/18/2021    Current Outpatient Medications on File Prior to Visit  Medication Sig Dispense Refill   atorvastatin (LIPITOR) 80 MG tablet TAKE 1 TABLET BY MOUTH EVERY DAY 90 tablet 3   busPIRone (BUSPAR) 10 MG tablet TAKE 1 TABLET BY MOUTH THREE TIMES A DAY AS NEEDED 90 tablet 3   cyanocobalamin ([facility] VITAMIN B12) 1000 MCG tablet TAKE 1 TABLET(1000 MCG) BY MOUTH DAILY 100 tablet 2   cycloSPORINE (RESTASIS) 0.05 % ophthalmic emulsion 1 drop 2 (two) times daily.     fluticasone (FLONASE) 50 MCG/ACT nasal spray 1 SPRAY IN EACH NOSTRIL EVERY DAY 16 mL 2   losartan (COZAAR) 100 MG tablet TAKE 1 TABLET BY MOUTH EVERY DAY 90 tablet 3   omeprazole (PRILOSEC) 20 MG capsule TAKE 1 CAPSULE BY MOUTH EVERY DAY 30 capsule 1   Semaglutide-Weight Management (WEGOVY) 0.25 MG/0.5ML SOAJ Inject 0.25 mg into the skin once a week. 2 mL 0   sertraline (ZOLOFT) 100 MG tablet TAKE 1 TABLET BY MOUTH EVERY DAY 90 tablet 3   traZODone (DESYREL) 100 MG tablet TAKE 1 TABLET BY MOUTH EVERYDAY AT BEDTIME 90 tablet 3   No current facility-administered medications on file prior to visit.    Family History  Problem Relation Age of Onset   Congestive Heart Failure Father     Social History   Socioeconomic History   Marital status: Married    Spouse name: Not on file   Number of children: Not on file   Years of education: Not on file   Highest education level: Not on file  Occupational History   Not on file  Tobacco Use   Smoking status: Never   Smokeless tobacco: Never   Substance and Sexual Activity   Alcohol use: Yes    Alcohol/week: 7.0 standard drinks of alcohol    Types: 7 Glasses of wine per week    Comment: 3 -4 times a week.   Drug use: Never   Sexual activity: Yes    Partners: Male  Other Topics Concern   Not on file  Social History Narrative   Not on file   Social Determinants of Health   Financial Resource Strain: Low Risk  (05/22/2022)   Overall Financial Resource Strain (CARDIA)    Difficulty of Paying Living Expenses: Not hard at all  Food Insecurity: No Food Insecurity (05/22/2022)   Hunger Vital Sign    Worried About Running Out of Food in the Last Year: Never true    Ran Out of Food in the Last Year: Never true  Transportation Needs: No Transportation Needs (05/22/2022)   PRAPARE - Administrator, Civil Service (Medical): No    Lack of Transportation (Non-Medical): No  Physical Activity: Sufficiently Active (05/22/2022)   Exercise Vital Sign    Days of Exercise per Week: 4 days    Minutes of Exercise per Session: 50 min  Stress: No Stress Concern Present (05/22/2022)   [employer] of Occupational Health - Occupational Stress Questionnaire  Feeling of Stress : Not at all  Social Connections: Unknown (05/22/2022)   Social Connection and Isolation Panel [NHANES]    Frequency of Communication with Friends and Family: More than three times a week    Frequency of Social Gatherings with Friends and Family: Once a week    Attends Religious Services: Patient declined    Database administrator or Organizations: Yes    Attends Banker Meetings: 1 to 4 times per year    Marital Status: Married  Recent Concern: Social Connections - Moderately Isolated (04/05/2022)   Social Connection and Isolation Panel [NHANES]    Frequency of Communication with Friends and Family: More than three times a week    Frequency of Social Gatherings with Friends and Family: More than three times a week    Attends Religious  Services: Never    Database administrator or Organizations: No    Attends Banker Meetings: Never    Marital Status: Married  Catering manager Violence: Not At Risk (05/22/2022)   Humiliation, Afraid, Rape, and Kick questionnaire    Fear of Current or Ex-Partner: No    Emotionally Abused: No    Physically Abused: No    Sexually Abused: No    Review of Systems: ROS negative except for what is noted on the assessment and plan.  Vitals:   02/17/23 1324 02/17/23 1338 02/17/23 1422  BP: (!) 146/89 (!) 146/86 (!) 146/88  Pulse: 98 93 89  Temp: 98.2 F (36.8 C)    TempSrc: Oral    SpO2: 98%    Weight: 185 lb 11.2 oz (84.2 kg)    Height: 5\' 4"  (1.626 m)      Physical Exam: Constitutional: well-appearing woman sitting in chair, in no acute distress HENT: normocephalic atraumatic, mucous membranes moist Eyes: conjunctiva non-erythematous Cardiovascular: regular rate and rhythm, no m/r/g Pulmonary/Chest: normal work of breathing on room air, lungs clear to auscultation bilaterally Abdominal: soft, non-tender, non-distended MSK: normal bulk and tone Neurological: alert & oriented x 3, no focal deficit Skin: warm and dry Psych: normal mood and behavior  Assessment & Plan:   Patient seen with Dr. Lafonda Mosses  Essential hypertension Elevated today 146/88 confirmed on recheck. No symptoms. She is very active and healthy - participates in silver sneakers. Primarily cooks own meals at home. Currently on max dose of losartan. Without major co morbidities, I feel we can wait before initiating another medicine. We discussed continued exercise and good dietary choices, but I will have her keep a blood pressure log at home. She will share these with me or at her next visit and can plan to start a second medicine if indicated. Likely amlodipine 5.  Panic disorder She is taking buspar 10 daily with an additional 1-2 as needed. Under good control. Driving is a stressor, but rare. Will  continue buspar.  Osteoarthritis of both hands Mild pain of both hands and wrists relieved with Voltaren gel. Will refill.  Obesity Patient recently underwent a liposuction procedure.  She is active and attends Silver sneakers multiple times per week.  She also has a habit of making her own food at home, rarely eating out.  She is doing a good job with activity level, will monitor going forward.  Katheran James, D.O. [facility] Internal Medicine, PGY-1 Phone: [phone] Date 02/17/2023 Time 4:37 PM

## 2023-02-17 NOTE — Assessment & Plan Note (Signed)
Elevated today 146/88 confirmed on recheck. No symptoms. She is very active and healthy - participates in silver sneakers. Primarily cooks own meals at home. Currently on max dose of losartan. Without major co morbidities, I feel we can wait before initiating another medicine. We discussed continued exercise and good dietary choices, but I will have her keep a blood pressure log at home. She will share these with me or at her next visit and can plan to start a second medicine if indicated. Likely amlodipine 5.

## 2023-02-17 NOTE — Assessment & Plan Note (Signed)
She is taking buspar 10 daily with an additional 1-2 as needed. Under good control. Driving is a stressor, but rare. Will continue buspar.

## 2023-02-17 NOTE — Patient Instructions (Signed)
Brenda Powell,  Overall you appear to be in good health. We need to decide if you need blood pressure medicine. Please start using your home cuff. You can compare it to cuffs elsewhere to get a sense of accuracy. Write down if it over or under-estimates your pressure. Please check in the morning - while at rest and before major activity or meals.  Please send me a mychart message with your pressures after 1 week, or bring your log to your next appointment.  Best, Dr. Ninfa Meeker

## 2023-02-17 NOTE — Assessment & Plan Note (Signed)
Patient recently underwent a liposuction procedure.  She is active and attends Silver sneakers multiple times per week.  She also has a habit of making her own food at home, rarely eating out.  She is doing a good job with activity level, will monitor going forward.

## 2023-02-17 NOTE — Assessment & Plan Note (Signed)
Mild pain of both hands and wrists relieved with Voltaren gel. Will refill.

## 2023-02-18 NOTE — Addendum Note (Signed)
Addended by: Derrek Monaco on: 02/18/2023 10:02 AM   Modules accepted: Level of Service

## 2023-02-18 NOTE — Progress Notes (Signed)
Internal Medicine Clinic Attending  I was physically present during the key portions of the resident provided service and participated in the medical decision making of patient's management care. I reviewed pertinent patient test results.  The assessment, diagnosis, and plan were formulated together and I agree with the documentation in the resident's note.  Mercie Eon, MD

## 2023-02-19 ENCOUNTER — Other Ambulatory Visit: Payer: Self-pay | Admitting: Internal Medicine

## 2023-02-19 DIAGNOSIS — F410 Panic disorder [episodic paroxysmal anxiety] without agoraphobia: Secondary | ICD-10-CM

## 2023-03-14 DIAGNOSIS — R519 Headache, unspecified: Secondary | ICD-10-CM | POA: Diagnosis not present

## 2023-03-14 DIAGNOSIS — R051 Acute cough: Secondary | ICD-10-CM | POA: Diagnosis not present

## 2023-03-14 DIAGNOSIS — Z03818 Encounter for observation for suspected exposure to other biological agents ruled out: Secondary | ICD-10-CM | POA: Diagnosis not present

## 2023-03-14 DIAGNOSIS — J019 Acute sinusitis, unspecified: Secondary | ICD-10-CM | POA: Diagnosis not present

## 2023-03-22 ENCOUNTER — Other Ambulatory Visit: Payer: Self-pay | Admitting: Internal Medicine

## 2023-03-22 DIAGNOSIS — G4700 Insomnia, unspecified: Secondary | ICD-10-CM

## 2023-04-17 DIAGNOSIS — M65342 Trigger finger, left ring finger: Secondary | ICD-10-CM | POA: Diagnosis not present

## 2023-04-30 ENCOUNTER — Other Ambulatory Visit: Payer: Self-pay | Admitting: Internal Medicine

## 2023-04-30 DIAGNOSIS — G4700 Insomnia, unspecified: Secondary | ICD-10-CM

## 2023-04-30 DIAGNOSIS — F410 Panic disorder [episodic paroxysmal anxiety] without agoraphobia: Secondary | ICD-10-CM

## 2023-05-01 NOTE — Telephone Encounter (Signed)
Medication sent to pharmacy

## 2023-07-14 ENCOUNTER — Telehealth: Payer: Self-pay | Admitting: Internal Medicine

## 2023-07-14 NOTE — Telephone Encounter (Signed)
 Copied from CRM [id number]. Topic: Appointments - Appointment Scheduling >> Jul 14, 2023  9:37 AM Hamdi H wrote: Patient called in to schedule her AWV/Physical but she would rather have her PCP changed to Dr. Lydia Sams who is the same PCP as her husband. Patient would like to come in the same day and close to the same time as her husband. Her husband is scheduled for 08/04/23 at 3:15 PM. Please give her a call back if you're able to change her PCP and schedule her the same time as her husband. She stated that since they're elderly, it's very hard for them to come in different times.  Patient sent message via my chart.

## 2023-07-28 DIAGNOSIS — M25531 Pain in right wrist: Secondary | ICD-10-CM | POA: Diagnosis not present

## 2023-07-28 DIAGNOSIS — M65342 Trigger finger, left ring finger: Secondary | ICD-10-CM | POA: Diagnosis not present

## 2023-07-30 DIAGNOSIS — M65342 Trigger finger, left ring finger: Secondary | ICD-10-CM | POA: Diagnosis not present

## 2023-09-22 ENCOUNTER — Encounter: Payer: Self-pay | Admitting: Internal Medicine

## 2023-09-23 ENCOUNTER — Ambulatory Visit: Admitting: Internal Medicine

## 2023-09-23 DIAGNOSIS — F419 Anxiety disorder, unspecified: Secondary | ICD-10-CM

## 2023-09-23 MED ORDER — BUSPIRONE HCL 15 MG PO TABS: 15.0000 mg | ORAL_TABLET | Freq: Three times a day (TID) | ORAL | 0 refills | Status: AC | PRN

## 2023-09-23 NOTE — Progress Notes (Signed)
  [facility] Internal Medicine Residency Telephone Encounter Continuity Care Appointment  HPI:  This telephone encounter was created for Ms. Brenda Powell on 09/23/2023 for the following purpose/cc: acute anxiety.  Brenda Powell husband unfortunately fell last week, followed by headache and seizure activity, admitted with ICH since Thursday, 6/19. He has not been doing well and she has been spending days with him in the hospital. Her daughters are nearby for support. Anxiety has understandably been worsened during this time. For chronic anxiety, she takes sertraline  100 mg daily, trazodone  100 at bedtime, and buspirone  10 mg. Typically takes buspirone  once daily at night before current events, now taking three times daily. She has not noticed significant effects.    Past Medical History:  Past Medical History:  Diagnosis Date   B12 deficiency    Dry eye    HTN (hypertension)    Hyperlipidemia    Insomnia    MDD (major depressive disorder)    Orthostatic hypotension 02/18/2021     Assessment / Plan / Recommendations:  Please see A&P under problem oriented charting for assessment of the patient's acute and chronic medical conditions.  Anxiety Assessment & Plan: Significant anxiety in the setting of husband's recent illness. We discussed options including increased buspirone  tid and this is what we will try. Increase to 15 mg tid prn. Encouraged Brenda Powell to call with effect in several days. Expect worsened anxiety will persist during husband's illness, unclear outcome yet per her report. She may benefit from counseling although not sure she will be able to attend during this acute phase.    Other orders -     busPIRone  HCl; Take 1 tablet (15 mg total) by mouth 3 (three) times daily as needed.  Dispense: 42 tablet; Refill: 0    As always, pt is advised that if symptoms worsen or new symptoms arise, they should go to an urgent care facility or to to ER for further evaluation.    Consent and Medical Decision Making:  This is a telephone encounter between Brenda Powell and Ronnald Sergeant on 09/23/2023 for acute anxiety. The visit was conducted with the patient located at hospital (husband admitted) and Ronnald Sergeant at [facility]. The patient has consented to being evaluated through a telephone encounter and understands the associated risks (an examination cannot be done and the patient may need to come in for an appointment) / benefits (allows the patient to remain at home, decreasing exposure to coronavirus). I personally spent 9 minutes on medical discussion.

## 2023-09-23 NOTE — Assessment & Plan Note (Addendum)
 Significant anxiety in the setting of husband's recent illness. We discussed options including increased buspirone  tid and this is what we will try. Increase to 15 mg tid prn. Encouraged Brenda Powell to call with effect in several days. Expect worsened anxiety will persist during husband's illness, unclear outcome yet per her report. She may benefit from counseling although not sure she will be able to attend during this acute phase.

## 2023-09-29 ENCOUNTER — Telehealth: Payer: Self-pay | Admitting: *Deleted

## 2023-09-29 ENCOUNTER — Ambulatory Visit: Payer: Self-pay

## 2023-09-29 MED ORDER — HYDROXYZINE HCL 25 MG PO TABS: 25.0000 mg | ORAL_TABLET | Freq: Three times a day (TID) | ORAL | 0 refills | Status: DC | PRN

## 2023-09-29 NOTE — Telephone Encounter (Signed)
 Called to discuss symptoms with Ms. Mirkin. Continuing to struggle with significant anxiety in the setting of husband's current health status. Planning to make big decisions about his care in the next several days. Increased dose of buspirone  not helping tid. She tried part of her daughter's Xanax which did help. We discussed limited course of Xanax vs. trial of hydroxyzine with discussion of side effects. Will trial hydroxyzine and I will f/u in 1-2 days to see if this is helping and to discuss alternative if needed.

## 2023-09-29 NOTE — Telephone Encounter (Signed)
 Call from patient states that her husband suffered a Stroke.  Stated that the Buspar  is not  helping and would like to get some Xanax.  Took 1/5 tablet of her daughters 5mg  and it helped.  Would like to get some sent to the [facility] at [address] if possible. Please call if able yo send in .

## 2023-09-29 NOTE — Telephone Encounter (Signed)
 Pt has spoken to office staff for care. No triage.      Copied from CRM [id number]. Topic: Clinical - Medication Question >> Sep 29, 2023  8:50 AM Laurier C wrote: Reason for CRM: Patient is asking for a call back at [phone] to see if the provider is able to up the dosage on the following medication: busPIRone  15 MG tablet Commonly known as: BUSPAR  >> Sep 29, 2023  8:59 AM Susanna ORN wrote: Patient called back to see about getting an immediate refill for anxiety medication. States she is in the hospital room with her husband, who is currently transitioning so she's needing something ASAP. Called [facility] & spoke with Charsetta who was able to speak with patient & get her over to triage nurse, Kenneth. Warm transferred.

## 2023-09-30 ENCOUNTER — Telehealth: Payer: Self-pay | Admitting: Internal Medicine

## 2023-09-30 NOTE — Telephone Encounter (Signed)
 Spoke with Brenda Powell this afternoon. She has not yet tried hydroxyzine but continues to take buspirone . Advised her to try hydroxyzine if she feels she needs further assistance with anxiety and she will let me know if things are not going well.

## 2023-11-06 ENCOUNTER — Other Ambulatory Visit: Payer: Self-pay | Admitting: *Deleted

## 2023-11-06 MED ORDER — LOSARTAN POTASSIUM 100 MG PO TABS: ORAL_TABLET | ORAL | 3 refills | Status: AC

## 2023-11-12 NOTE — Telephone Encounter (Signed)
 Patient sent message via my chart to contact our office to schedule future appointment with PCP.

## 2023-11-20 DIAGNOSIS — M25531 Pain in right wrist: Secondary | ICD-10-CM | POA: Diagnosis not present

## 2023-11-25 ENCOUNTER — Encounter: Payer: Self-pay | Admitting: Internal Medicine

## 2023-11-25 ENCOUNTER — Ambulatory Visit (INDEPENDENT_AMBULATORY_CARE_PROVIDER_SITE_OTHER): Admitting: Internal Medicine

## 2023-11-25 VITALS — BP 136/93 | HR 89 | Temp 98.3°F | Ht 64.0 in | Wt 182.4 lb

## 2023-11-25 DIAGNOSIS — I10 Essential (primary) hypertension: Secondary | ICD-10-CM | POA: Diagnosis not present

## 2023-11-25 DIAGNOSIS — M722 Plantar fascial fibromatosis: Secondary | ICD-10-CM

## 2023-11-25 DIAGNOSIS — D7589 Other specified diseases of blood and blood-forming organs: Secondary | ICD-10-CM | POA: Diagnosis not present

## 2023-11-25 DIAGNOSIS — F4320 Adjustment disorder, unspecified: Secondary | ICD-10-CM | POA: Diagnosis not present

## 2023-11-25 DIAGNOSIS — R7989 Other specified abnormal findings of blood chemistry: Secondary | ICD-10-CM

## 2023-11-25 DIAGNOSIS — Z0000 Encounter for general adult medical examination without abnormal findings: Secondary | ICD-10-CM | POA: Insufficient documentation

## 2023-11-25 DIAGNOSIS — E782 Mixed hyperlipidemia: Secondary | ICD-10-CM

## 2023-11-25 DIAGNOSIS — E538 Deficiency of other specified B group vitamins: Secondary | ICD-10-CM

## 2023-11-25 DIAGNOSIS — Z1231 Encounter for screening mammogram for malignant neoplasm of breast: Secondary | ICD-10-CM

## 2023-11-25 NOTE — Assessment & Plan Note (Signed)
 Patient's last mammogram was 12/2022. Patient is amenable to repeat mammogram this year.  - mammogram ordered

## 2023-11-25 NOTE — Assessment & Plan Note (Signed)
 Patient has been taking atorvastatin  80mg  daily and is tolerating this medication well.  - lipid panel - continue atorvastatin  80mg 

## 2023-11-25 NOTE — Assessment & Plan Note (Signed)
 Patient has recently experience loss of her husband in the last month. She has been spending more time with family since this occurred, specifically her daughters and her grandchildren. She has been spending time with her neighbors who have also experienced loss of their spouse. She has also gotten a dog in this time period, Pixie, who she says has significantly helped her. She is focused on keeping busy in this time period by doing house chores and going to appointments for her health. She has noticed some decreased appetite and fatigue in this time period. She feels that her regimen of Buspar , sertraline , and trazodone  have been helping her with her mood and also her sleep. She feels that her next step would be to begin speaking to a counselor. - referral to grief counseling - continue Buspar  15 mg TID - continue trazodone  100mg  - continue sertraline  100mg 

## 2023-11-25 NOTE — Assessment & Plan Note (Signed)
 Patient suspects that she has plantar fasciitis due to recent onset of pain when barefoot that hurts worse in the mornings. She feels the pain when she is barefoot on the plantar aspect of her heel. When she is wearing shoes like sneakers, she feels the pain on the dorsal aspect of her foot. Physical exam shows some tenderness to palpation of the plantar aspect of the foot in the heel area.  - discussed and attached exercises for plantar fasciitis to AVS

## 2023-11-25 NOTE — Assessment & Plan Note (Signed)
 Patient typically uses her apple watch to measure her BP. She says it is usually about 110/80 and most recently was 102/68. Her BP in office today was 142/87 and 136/93 on repeat. She does not typically take her BP with a cuff a home, but has a wrist cuff. She is notably tearful in office today and finds it difficult to be in a healthcare setting given recent loss of her husband a month ago in the hospital.  - nurse visit in 2 weeks to retake BP - encourage patient to measure BP every day with cuff and record results - follow up in 3 months - BMP w GFR - microalbumin/creatinine urine ratio

## 2023-11-25 NOTE — Assessment & Plan Note (Signed)
 Patient is currently taking vitamin B12 with a history of vitamin B12 deficiency and macrocytosis - CBC - continue vitamin B12

## 2023-11-25 NOTE — Progress Notes (Signed)
 This is a Psychologist, occupational Note.  The care of the patient was discussed with Dr. Karna and the assessment and plan was formulated with their assistance.  Please see their note for official documentation of the patient encounter.   Subjective:   Patient ID: Brenda Powell female   DOB: Feb 01, 1951 73 y.o.   MRN: [id number]  HPI: [name] is a 73 y.o. with PMH HTN, HLD, osteoarthritis of both hands, and grief reaction who presents for a follow up today.   For the details of today's visit, please refer to the assessment and plan.     Past Medical History:  Diagnosis Date   B12 deficiency    Dry eye    HTN (hypertension)    Hyperlipidemia    Insomnia    MDD (major depressive disorder)    Orthostatic hypotension 02/18/2021   Current Outpatient Medications  Medication Sig Dispense Refill   atorvastatin  (LIPITOR) 80 MG tablet TAKE 1 TABLET BY MOUTH EVERY DAY 90 tablet 3   busPIRone  (BUSPAR ) 15 MG tablet Take 1 tablet (15 mg total) by mouth 3 (three) times daily as needed. 42 tablet 0   cyanocobalamin ([facility] VITAMIN B12) 1000 MCG tablet TAKE 1 TABLET(1000 MCG) BY MOUTH DAILY 100 tablet 2   losartan  (COZAAR ) 100 MG tablet TAKE 1 TABLET BY MOUTH EVERY DAY 90 tablet 3   omeprazole  (PRILOSEC) 20 MG capsule TAKE 1 CAPSULE BY MOUTH EVERY DAY 30 capsule 1   sertraline  (ZOLOFT ) 100 MG tablet TAKE 1 TABLET BY MOUTH EVERY DAY 90 tablet 3   traZODone  (DESYREL ) 100 MG tablet TAKE 1 TABLET BY MOUTH EVERYDAY AT BEDTIME 90 tablet 3   No current facility-administered medications for this visit.    Review of Systems: Pertinent items are noted in HPI. Objective:  Physical Exam: Vitals:   11/25/23 0956 11/25/23 1030  BP: (!) 142/87 (!) 136/93  Pulse: 99 89  Temp: 98.3 F (36.8 C)   TempSrc: Oral   SpO2: 96%   Weight: 182 lb 6.4 oz (82.7 kg)   Height: 5' 4 (1.626 m)     Constitutional: Tearful but able to converse, intermittently crying Cardiovascular: RRR, no murmurs, rubs, or  gallops.  Pulmonary/Chest: CTAB, no wheezes, rales, or rhonchi.  Left foot: Dry skin appreciated on heel. Mild tenderness to palpation of plantar aspect of foot near the heel. Hands: Dry skin and flaking appreciated on tips of fingers bilaterally, with greater focus on the thumbs   Assessment & Plan:   Healthcare maintenance Patient's last mammogram was 12/2022. Patient is amenable to repeat mammogram this year.  - mammogram ordered  Grief reaction Patient has recently experience loss of her husband in the last month. She has been spending more time with family since this occurred, specifically her daughters and her grandchildren. She has been spending time with her neighbors who have also experienced loss of their spouse. She has also gotten a dog in this time period, Pixie, who she says has significantly helped her. She is focused on keeping busy in this time period by doing house chores and going to appointments for her health. She has noticed some decreased appetite and fatigue in this time period. She feels that her regimen of Buspar , sertraline , and trazodone  have been helping her with her mood and also her sleep. She feels that her next step would be to begin speaking to a counselor. - referral to grief counseling - continue Buspar  15 mg TID - continue trazodone  100mg  - continue sertraline   100mg   Elevated triglycerides with high cholesterol Patient has been taking atorvastatin  80mg  daily and is tolerating this medication well.  - lipid panel - continue atorvastatin  80mg   Vitamin B12 deficiency (non anemic) Patient is currently taking vitamin B12 with a history of vitamin B12 deficiency and macrocytosis - CBC - continue vitamin B12  Essential hypertension Patient typically uses her apple watch to measure her BP. She says it is usually about 110/80 and most recently was 102/68. Her BP in office today was 142/87 and 136/93 on repeat. She does not typically take her BP with a cuff a  home, but has a wrist cuff. She is notably tearful in office today and finds it difficult to be in a healthcare setting given recent loss of her husband a month ago in the hospital.  - nurse visit in 2 weeks to retake BP - encourage patient to measure BP every day with cuff and record results - follow up in 3 months - BMP w GFR - microalbumin/creatinine urine ratio  Plantar fasciitis Patient suspects that she has plantar fasciitis due to recent onset of pain when barefoot that hurts worse in the mornings. She feels the pain when she is barefoot on the plantar aspect of her heel. When she is wearing shoes like sneakers, she feels the pain on the dorsal aspect of her foot. Physical exam shows some tenderness to palpation of the plantar aspect of the foot in the heel area.  - discussed and attached exercises for plantar fasciitis to AVS

## 2023-11-25 NOTE — Progress Notes (Signed)
 Attestation for Student Documentation:  I personally was present and re-performed the history, physical exam and medical decision-making activities of this service and have verified that the service and findings are accurately documented in the student's note.  Ms. Brenda Powell is seen today for f/u of chronic medical conditions. Unfortunately, her husband passed away since last visit and she is struggling with grief. No thoughts of self-harm or SI. Medications continue to be helpful, however she is interested in grief counseling today which I agree may be very beneficial. It sounds like she has great social support. Asked her to please reach out if she needs further assistance.   Mild elevation in BP. Encourage home monitoring and return in 2 weeks for RN BP visit and cuff comparison. Continue losartan  100 mg daily. BMP today.   BMP and uACR to follow concern for potential early chronic kidney disease. Elevated serum creatinine and mildly decreased eGFR January and February 2024. CBC and B12 for persistent macrocytosis, continues on oral supplementation.   Karna Fellows, MD 11/25/2023, 2:58 PM

## 2023-11-25 NOTE — Patient Instructions (Addendum)
 Hi Brenda Powell,   Thank you for letting us  provide your care today. We have sent in a refill of Buspar  to your pharmacy.   We would like to see you in 2 weeks for a nurses visit to retake your blood pressure. We recommend taking your blood pressure every day with your wrist cuff and writing these results down, and bringing them to your follow up appointments.   Additionally, we would like to see you in 3 months for an office visit.   You had some lab work done today and we will call you with the results.   A mammogram has been ordered for you, and you should receive a call to schedule this.   We have referred you to a counselor, and they should contact you soon. If they do not contact you in the next week, please reach out to us .   Lastly, attached are some exercises that are beneficial for plantar fasciitis. If you have any questions or concerns, call our clinic at [phone] or after hours call [phone] and ask for the internal medicine resident on call.   Thank you!

## 2023-11-26 LAB — CBC
Hematocrit: 41.3 % (ref 34.0–46.6)
Hemoglobin: 13.2 g/dL (ref 11.1–15.9)
MCH: 32.1 pg (ref 26.6–33.0)
MCHC: 32 g/dL (ref 31.5–35.7)
MCV: 101 fL — ABNORMAL HIGH (ref 79–97)
Platelets: 290 x10E3/uL (ref 150–450)
RBC: 4.11 x10E6/uL (ref 3.77–5.28)
RDW: 12.4 % (ref 11.7–15.4)
WBC: 7.9 x10E3/uL (ref 3.4–10.8)

## 2023-11-26 LAB — BASIC METABOLIC PANEL WITH GFR
BUN/Creatinine Ratio: 30 — ABNORMAL HIGH (ref 12–28)
BUN: 32 mg/dL — ABNORMAL HIGH (ref 8–27)
CO2: 17 mmol/L — ABNORMAL LOW (ref 20–29)
Calcium: 9.4 mg/dL (ref 8.7–10.3)
Chloride: 103 mmol/L (ref 96–106)
Creatinine, Ser: 1.08 mg/dL — ABNORMAL HIGH (ref 0.57–1.00)
Glucose: 101 mg/dL — ABNORMAL HIGH (ref 70–99)
Potassium: 4.5 mmol/L (ref 3.5–5.2)
Sodium: 137 mmol/L (ref 134–144)
eGFR: 55 mL/min/1.73 — ABNORMAL LOW (ref >59)

## 2023-11-26 LAB — LIPID PANEL
Chol/HDL Ratio: 2.3 ratio (ref 0.0–4.4)
Cholesterol, Total: 165 mg/dL (ref 100–199)
HDL: 72 mg/dL (ref >39)
LDL Chol Calc (NIH): 76 mg/dL (ref 0–99)
Triglycerides: 96 mg/dL (ref 0–149)
VLDL Cholesterol Cal: 17 mg/dL (ref 5–40)

## 2023-11-26 LAB — MICROALBUMIN / CREATININE URINE RATIO
Creatinine, Urine: 156 mg/dL
Microalb/Creat Ratio: 5 mg/g{creat} (ref 0–29)
Microalbumin, Urine: 7.6 ug/mL

## 2023-11-27 ENCOUNTER — Ambulatory Visit: Payer: Self-pay | Admitting: Internal Medicine

## 2023-11-27 DIAGNOSIS — D7589 Other specified diseases of blood and blood-forming organs: Secondary | ICD-10-CM

## 2023-11-27 LAB — VITAMIN B12: Vitamin B-12: 1013 pg/mL (ref 232–1245)

## 2023-11-27 LAB — SPECIMEN STATUS REPORT

## 2023-11-27 NOTE — Progress Notes (Signed)
 Labs consistent with CKD3a, no microalbuminuria. Reviewed with Ms. Mogle 8/28. Currently on ARB. Will continue to monitor q56m.

## 2023-11-27 NOTE — Progress Notes (Signed)
 Lipids at goal on current statin for primary prevention. No changes today. Reviewed with Ms. Kimber 8/28.

## 2023-11-27 NOTE — Progress Notes (Signed)
 Persistent macrocytosis, normal B12 on supplementation. Reviewed with Brenda Powell 8/28. She reports 1-2 alcohol beverages/night, no persistent higher use. Will return for smear, TSH, folate, liver profile.

## 2023-12-03 ENCOUNTER — Other Ambulatory Visit

## 2023-12-03 DIAGNOSIS — F32A Depression, unspecified: Secondary | ICD-10-CM

## 2023-12-03 DIAGNOSIS — D7589 Other specified diseases of blood and blood-forming organs: Secondary | ICD-10-CM | POA: Diagnosis not present

## 2023-12-03 DIAGNOSIS — M79672 Pain in left foot: Secondary | ICD-10-CM | POA: Diagnosis not present

## 2023-12-03 DIAGNOSIS — M79671 Pain in right foot: Secondary | ICD-10-CM | POA: Diagnosis not present

## 2023-12-03 LAB — CBC WITH DIFFERENTIAL/PLATELET
Abs Immature Granulocytes: 0.02 K/uL (ref 0.00–0.07)
Basophils Absolute: 0.1 K/uL (ref 0.0–0.1)
Basophils Relative: 1 %
Eosinophils Absolute: 0.1 K/uL (ref 0.0–0.5)
Eosinophils Relative: 2 %
HCT: 39.5 % (ref 36.0–46.0)
Hemoglobin: 12.9 g/dL (ref 12.0–15.0)
Immature Granulocytes: 0 %
Lymphocytes Relative: 25 %
Lymphs Abs: 1.9 K/uL (ref 0.7–4.0)
MCH: 32.4 pg (ref 26.0–34.0)
MCHC: 32.7 g/dL (ref 30.0–36.0)
MCV: 99.2 fL (ref 80.0–100.0)
Monocytes Absolute: 0.7 K/uL (ref 0.1–1.0)
Monocytes Relative: 9 %
Neutro Abs: 4.9 K/uL (ref 1.7–7.7)
Neutrophils Relative %: 63 %
Platelets: 286 K/uL (ref 150–400)
RBC: 3.98 MIL/uL (ref 3.87–5.11)
RDW: 13.2 % (ref 11.5–15.5)
WBC: 7.7 K/uL (ref 4.0–10.5)
nRBC: 0 % (ref 0.0–0.2)

## 2023-12-03 LAB — TECHNOLOGIST SMEAR REVIEW: Plt Morphology: NORMAL

## 2023-12-04 LAB — TSH RFX ON ABNORMAL TO FREE T4
TSH: 2.28 u[IU]/mL (ref 0.450–4.500)
TSH: 2.46 u[IU]/mL (ref 0.450–4.500)

## 2023-12-04 LAB — HEPATIC FUNCTION PANEL
ALT: 40 IU/L — ABNORMAL HIGH (ref 0–32)
AST: 42 IU/L — ABNORMAL HIGH (ref 0–40)
Albumin: 4.1 g/dL (ref 3.8–4.8)
Alkaline Phosphatase: 70 IU/L (ref 44–121)
Bilirubin Total: 0.6 mg/dL (ref 0.0–1.2)
Bilirubin, Direct: 0.2 mg/dL (ref 0.00–0.40)
Total Protein: 6.3 g/dL (ref 6.0–8.5)

## 2023-12-04 LAB — FOLATE: Folate: 12.1 ng/mL (ref >3.0)

## 2023-12-05 ENCOUNTER — Ambulatory Visit: Payer: Self-pay | Admitting: Internal Medicine

## 2023-12-05 NOTE — Progress Notes (Signed)
 Reviewed labs with Brenda Powell 9/5. MCV now just under 100. No cytopenias or other evidence of bone marrow disturbance. TSH and folate wnl. 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 We discussed reduced alcohol consumption and risk factor modification for MASLD with lifestyle. FIB4 score 1.65, advanced fibrosis unlikely in this age group. We discussed possibility of bone marrow disorder, however lower suspicion of clinically significant disease given absence of cytopenias. Will plan to follow CBC/MCV q6-12 months.

## 2023-12-17 ENCOUNTER — Ambulatory Visit

## 2023-12-17 VITALS — Ht 64.0 in | Wt 177.0 lb

## 2023-12-17 DIAGNOSIS — Z0000 Encounter for general adult medical examination without abnormal findings: Secondary | ICD-10-CM

## 2023-12-17 DIAGNOSIS — F410 Panic disorder [episodic paroxysmal anxiety] without agoraphobia: Secondary | ICD-10-CM

## 2023-12-17 MED ORDER — OMEPRAZOLE 20 MG PO CPDR: 20.0000 mg | DELAYED_RELEASE_CAPSULE | Freq: Every day | ORAL | 3 refills | Status: AC

## 2023-12-17 NOTE — Progress Notes (Signed)
 Subjective:   Brenda Powell is a 73 y.o. female who presents for Medicare Annual (Subsequent) preventive examination.  Visit Complete: Virtual I connected with  Kennis Buell on 12/17/23 by a video and audio enabled telemedicine application and verified that I am speaking with the correct person using two identifiers.  Patient Location: Home  Provider Location: Home Office  I discussed the limitations of evaluation and management by telemedicine. The patient expressed understanding and agreed to proceed.  Vital Signs: Because this visit was a virtual/telehealth visit, some criteria may be missing or patient reported. Any vitals not documented were not able to be obtained and vitals that have been documented are patient reported.  Cardiac Risk Factors include: advanced age (>66men, >48 women);hypertension     Objective:    Today's Vitals   12/17/23 1106  Weight: 177 lb (80.3 kg)  Height: 5' 4 (1.626 m)   Body mass index is 30.38 kg/m.     12/17/2023   11:15 AM 11/25/2023    9:57 AM 05/22/2022   12:26 PM 05/22/2022   10:15 AM 04/29/2022    8:31 AM 04/05/2022   10:00 AM 06/06/2021    2:39 PM  Advanced Directives  Does Patient Have a Medical Advance Directive? Yes Yes Yes Yes Yes Yes Yes  Type of Estate agent of [place];Living will Living will;Healthcare Power of [employer] Power of [place];Living will Healthcare Power of [place];Living will Healthcare Power of [place];Living will Healthcare Power of [place];Living will Healthcare Power of [place];Living will  Does patient want to make changes to medical advance directive? No - Patient declined No - Patient declined   No - Patient declined No - Patient declined No - Patient declined  Copy of Healthcare Power of Attorney in Chart? Yes - validated most recent copy scanned in chart (See row information) Yes - validated most recent copy scanned in chart (See row information) Yes - validated most  recent copy scanned in chart (See row information) Yes - validated most recent copy scanned in chart (See row information) Yes - validated most recent copy scanned in chart (See row information) Yes - validated most recent copy scanned in chart (See row information) Yes - validated most recent copy scanned in chart (See row information)  Would patient like information on creating a medical advance directive?   No - Patient declined        Current Medications (verified) Outpatient Encounter Medications as of 12/17/2023  Medication Sig   atorvastatin  (LIPITOR) 80 MG tablet TAKE 1 TABLET BY MOUTH EVERY DAY   busPIRone  (BUSPAR ) 15 MG tablet Take 1 tablet (15 mg total) by mouth 3 (three) times daily as needed.   cyanocobalamin ([facility] VITAMIN B12) 1000 MCG tablet TAKE 1 TABLET(1000 MCG) BY MOUTH DAILY   losartan  (COZAAR ) 100 MG tablet TAKE 1 TABLET BY MOUTH EVERY DAY   meloxicam (MOBIC) 15 MG tablet Take 15 mg by mouth daily.   sertraline  (ZOLOFT ) 100 MG tablet TAKE 1 TABLET BY MOUTH EVERY DAY   traZODone  (DESYREL ) 100 MG tablet TAKE 1 TABLET BY MOUTH EVERYDAY AT BEDTIME   [DISCONTINUED] omeprazole  (PRILOSEC) 20 MG capsule TAKE 1 CAPSULE BY MOUTH EVERY DAY   omeprazole  (PRILOSEC) 20 MG capsule Take 1 capsule (20 mg total) by mouth daily.   No facility-administered encounter medications on file as of 12/17/2023.    Allergies (verified) Patient has no known allergies.   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   Drug use: Never   Sexual activity: Yes    Partners: Male  Other Topics Concern   Not on file  Social History Narrative   Not on file   Social Drivers of Health   Financial Resource Strain: Low Risk  (12/17/2023)   Overall Financial Resource Strain (CARDIA)    Difficulty of Paying Living Expenses: Not hard at all  Food Insecurity: No Food Insecurity (12/17/2023)   Hunger Vital Sign    Worried About Running Out of Food in the Last Year: Never true    Ran Out of Food in the Last Year: Never true  Transportation Needs: No Transportation Needs (12/17/2023)   PRAPARE - Administrator, Civil Service (Medical): No    Lack of Transportation (Non-Medical): No  Physical Activity: Sufficiently Active (12/17/2023)   Exercise Vital Sign    Days of Exercise per Week: 7 days    Minutes of Exercise per Session: 30 min  Stress: No Stress Concern Present (12/17/2023)   [employer] of Occupational Health - Occupational Stress Questionnaire    Feeling of Stress: Not at all  Social Connections: Moderately Isolated (12/17/2023)   Social Connection and Isolation Panel    Frequency of Communication with Friends and Family: More than three times a week    Frequency of Social Gatherings with Friends and Family: Three times a week    Attends Religious Services: Never    Active Member of Clubs or Organizations: Yes    Attends Banker Meetings: 1 to 4 times per year    Marital Status: Widowed    Tobacco  Counseling Counseling given: Not Answered   Clinical Intake:  Pre-visit preparation completed: Yes  Pain : No/denies pain     BMI - recorded: 30.38 Nutritional Risks: None Diabetes: No  How often do you need to have someone help you when you read instructions, pamphlets, or other written materials from your doctor or pharmacy?: 1 - Never  Interpreter Needed?: No  Information entered by :: Arnette Hoots, CMA   Activities of Daily Living    12/17/2023   11:07 AM  In your present state of health, do you have any difficulty performing the following activities:  Hearing? 0  Vision? 0  Difficulty concentrating or making decisions? 0  Walking or climbing stairs? 0  Dressing or bathing? 0  Doing errands, shopping? 0  Preparing Food and eating ? N  Using the Toilet? N  In the past six months, have you accidently leaked urine? N  Do you have problems with loss of bowel control? N  Managing your Medications? N  Managing your Finances? N  Housekeeping or managing your Housekeeping? N    Patient Care Team: Karna Fellows, MD as PCP - General (Internal Medicine)  Indicate any recent Medical Services you may have received from other than [facility] providers in the past year (date may be approximate).     Assessment:   This is a routine wellness examination for Brenda Powell.  Hearing/Vision screen Hearing Screening - Comments:: No issues Vision Screening - Comments:: No issues, wears glasses   Goals Addressed             This Visit's Progress    Patient Stated       Keep moving. 2-3 weeks silver sneakers. 13000 steps a day, walks dog a couple times a day.        Depression Screen    12/17/2023   11:10 AM 11/25/2023   10:08 AM 05/22/2022   12:27 PM 05/22/2022   10:15 AM 04/29/2022    8:30 AM 04/05/2022   10:06 AM 06/06/2021    2:42 PM  PHQ 2/9 Scores  PHQ - 2 Score 2 2 0 0 0 0 0  PHQ- 9 Score 3 7 0 0  0     Fall Risk    12/17/2023   11:16 AM 11/25/2023    9:55 AM 05/22/2022    12:26 PM 05/22/2022   10:15 AM 04/29/2022    8:30 AM  Fall Risk   Falls in the past year? 0 0 0 0 0  Number falls in past yr: 0 0 0 0 0  Injury with Fall? 0 0 0 0 0  Risk for fall due to : No Fall Risks No Fall Risks No Fall Risks No Fall Risks No Fall Risks  Follow up Falls evaluation completed;Education provided Falls prevention discussed Falls evaluation completed;Falls prevention discussed Falls evaluation completed;Falls prevention discussed Falls evaluation completed;Falls prevention discussed    MEDICARE RISK AT HOME: Medicare Risk at Home Any stairs in or around the home?: Yes If so, are there any without handrails?: No Home free of loose throw rugs in walkways, pet beds, electrical cords, etc?: Yes Adequate lighting in your home to reduce risk of falls?: Yes Life alert?: No Use of a cane, walker or w/c?: No Grab bars in the bathroom?: Yes Shower chair or bench in shower?: Yes Elevated toilet seat or a handicapped toilet?: No  TIMED UP AND GO:  Was the test performed?  No    Cognitive Function:        12/17/2023   11:17 AM 05/22/2022   12:27 PM  6CIT Screen  What Year? 0 points 0 points  What month? 0 points 0 points  What time? 0 points 0 points  Count back from 20 0 points 0 points  Months in reverse 0 points 0 points  Repeat phrase 0 points 0 points  Total Score 0 points 0 points    Immunizations Immunization History  Administered Date(s) Administered   Fluad Quad(high Dose 65+) 12/24/2021   INFLUENZA, HIGH DOSE SEASONAL PF 12/17/2018   Influenza Split 02/02/2018   Influenza,inj,Quad PF,6+ Mos 12/16/2019   Moderna Sars-Covid-2 Vaccination 05/06/2019, 06/10/2019, 03/02/2020   Pneumococcal Conjugate-13 02/17/2018   Pneumococcal Polysaccharide-23 05/22/2020   Tdap 12/31/2012   Zoster, Live 10/20/2012  TDAP status: Due, Education has been provided regarding the importance of this vaccine. Advised may receive this vaccine at local pharmacy or Health  Dept. Aware to provide a copy of the vaccination record if obtained from local pharmacy or Health Dept. Verbalized acceptance and understanding.  Flu Vaccine status: Due, Education has been provided regarding the importance of this vaccine. Advised may receive this vaccine at local pharmacy or Health Dept. Aware to provide a copy of the vaccination record if obtained from local pharmacy or Health Dept. Verbalized acceptance and understanding.  Pneumococcal vaccine status: Due, Education has been provided regarding the importance of this vaccine. Advised may receive this vaccine at local pharmacy or Health Dept. Aware to provide a copy of the vaccination record if obtained from local pharmacy or Health Dept. Verbalized acceptance and understanding.  Covid-19 vaccine status: Information provided on how to obtain vaccines.   Qualifies for Shingles Vaccine? Yes   Zostavax completed Yes   Shingrix Completed?: No.    Education has been provided regarding the importance of this vaccine. Patient has been advised to call insurance company to determine out of pocket expense if they have not yet received this vaccine. Advised may also receive vaccine at local pharmacy or Health Dept. Verbalized acceptance and understanding.  Screening Tests Health Maintenance  Topic Date Due   Zoster Vaccines- Shingrix (1 of 2) 02/25/2002   DTaP/Tdap/Td (2 - Td or Tdap) 01/01/2023   Medicare Annual Wellness (AWV)  05/23/2023   Influenza Vaccine  10/31/2023   COVID-19 Vaccine (4 - 2025-26 season) 12/01/2023   Mammogram  12/11/2024   Colonoscopy  07/10/2027   Pneumococcal Vaccine: 50+ Years  Completed   DEXA SCAN  Completed   Hepatitis C Screening  Completed   HPV VACCINES  Aged Out   Meningococcal B Vaccine  Aged Out    Health Maintenance  Health Maintenance Due  Topic Date Due   Zoster Vaccines- Shingrix (1 of 2) 02/25/2002   DTaP/Tdap/Td (2 - Td or Tdap) 01/01/2023   Medicare Annual Wellness (AWV)   05/23/2023   Influenza Vaccine  10/31/2023   COVID-19 Vaccine (4 - 2025-26 season) 12/01/2023    Colorectal cancer screening: Type of screening: Colonoscopy. Completed 2019. Repeat every 10 years  Mammogram status: Completed 12/12/22. Repeat every year  Bone Density status: Completed 07/23/2017. Results reflect: Bone density results: . Repeat every 2 years.  Lung Cancer Screening: (Low Dose CT Chest recommended if Age 34-80 years, 20 pack-year currently smoking OR have quit w/in 15years.) does not qualify.   Lung Cancer Screening Referral: n/a  Additional Screening:  Hepatitis C Screening: does qualify; Completed 2021  Vision Screening: Recommended annual ophthalmology exams for early detection of glaucoma and other disorders of the eye. Is the patient up to date with their annual eye exam?  Yes  Who is the provider or what is the name of the office in which the patient attends annual eye exams? [facility] If pt is not established with a provider, would they like to be referred to a provider to establish care? No .   Dental Screening: Recommended annual dental exams for proper oral hygiene  Community Resource Referral / Chronic Care Management: CRR required this visit?  No   CCM required this visit?  No     Plan:     I have personally reviewed and noted the following in the patient's chart:   Medical and social history Use of alcohol, tobacco or illicit drugs  Current  medications and supplements including opioid prescriptions. Patient is not currently taking opioid prescriptions. Functional ability and status Nutritional status Physical activity Advanced directives List of other physicians Hospitalizations, surgeries, and ER visits in previous 12 months Vitals Screenings to include cognitive, depression, and falls Referrals and appointments  In addition, I have reviewed and discussed with patient certain preventive protocols, quality metrics, and best  practice recommendations. A written personalized care plan for preventive services as well as general preventive health recommendations were provided to patient.     Arnette LOISE Hoots, CMA   12/17/2023   After Visit Summary: (MyChart) Due to this being a telephonic visit, the after visit summary with patients personalized plan was offered to patient via MyChart   Nurse Notes: Patients husband recently passed but she is dealing with it. She has some times where she is sad but keeping active and busy to help work through it.  Medications have been refilled per protocol.

## 2023-12-17 NOTE — Patient Instructions (Signed)
 Brenda Powell,  Thank you for taking the time for your Medicare Wellness Visit. I appreciate your continued commitment to your health goals. Please review the care plan we discussed, and feel free to reach out if I can assist you further.  Medicare recommends these wellness visits once per year to help you and your care team stay ahead of potential health issues. These visits are designed to focus on prevention, allowing your provider to concentrate on managing your acute and chronic conditions during your regular appointments.  Please note that Annual Wellness Visits do not include a physical exam. Some assessments may be limited, especially if the visit was conducted virtually. If needed, we may recommend a separate in-person follow-up with your provider.  Ongoing Care Seeing your primary care provider every 3 to 6 months helps us  monitor your health and provide consistent, personalized care.   Referrals If a referral was made during today's visit and you haven't received any updates within two weeks, please contact the referred provider directly to check on the status.  Recommended Screenings:  Health Maintenance  Topic Date Due   Zoster (Shingles) Vaccine (1 of 2) 02/25/2002   DTaP/Tdap/Td vaccine (2 - Td or Tdap) 01/01/2023   Flu Shot  10/31/2023   COVID-19 Vaccine (4 - 2025-26 season) 12/01/2023   Breast Cancer Screening  12/11/2024   Medicare Annual Wellness Visit  12/16/2024   Colon Cancer Screening  07/10/2027   Pneumococcal Vaccine for age over 16  Completed   DEXA scan (bone density measurement)  Completed   Hepatitis C Screening  Completed   HPV Vaccine  Aged Out   Meningitis B Vaccine  Aged Out       12/17/2023   11:15 AM  Advanced Directives  Does Patient Have a Medical Advance Directive? Yes  Type of Estate agent of [place];Living will  Does patient want to make changes to medical advance directive? No - Patient declined  Copy of Healthcare  Power of Attorney in Chart? Yes - validated most recent copy scanned in chart (See row information)   Advance Care Planning is important because it: Ensures you receive medical care that aligns with your values, goals, and preferences. Provides guidance to your family and loved ones, reducing the emotional burden of decision-making during critical moments.  Vision: Annual vision screenings are recommended for early detection of glaucoma, cataracts, and diabetic retinopathy. These exams can also reveal signs of chronic conditions such as diabetes and high blood pressure.  Dental: Annual dental screenings help detect early signs of oral cancer, gum disease, and other conditions linked to overall health, including heart disease and diabetes. Healthy Eating, Adult Healthy eating may help you get and keep a healthy body weight, reduce the risk of chronic disease, and live a long and productive life. It is important to follow a healthy eating pattern. Your nutritional and calorie needs should be met mainly by different nutrient-rich foods. What are tips for following this plan? Reading food labels Read labels and choose the following: Reduced or low sodium products. Juices with 100% fruit juice. Foods with low saturated fats (<3 g per serving) and high polyunsaturated and monounsaturated fats. Foods with whole grains, such as whole wheat, cracked wheat, brown rice, and wild rice. Whole grains that are fortified with folic acid . This is recommended for females who are pregnant or who want to become pregnant. Read labels and do not eat or drink the following: Foods or drinks with added sugars. These include foods  that contain brown sugar, corn sweetener, corn syrup, dextrose, fructose, glucose, high-fructose corn syrup, honey, invert sugar, lactose, malt syrup, maltose, molasses, raw sugar, sucrose, trehalose, or turbinado sugar. Limit your intake of added sugars to less than 10% of your total daily  calories. Do not eat more than the following amounts of added sugar per day: 6 teaspoons (25 g) for females. 9 teaspoons (38 g) for males. Foods that contain processed or refined starches and grains. Refined grain products, such as white flour, degermed cornmeal, white bread, and white rice. Shopping Choose nutrient-rich snacks, such as vegetables, whole fruits, and nuts. Avoid high-calorie and high-sugar snacks, such as potato chips, fruit snacks, and candy. Use oil-based dressings and spreads on foods instead of solid fats such as butter, margarine, sour cream, or cream cheese. Limit pre-made sauces, mixes, and instant products such as flavored rice, instant noodles, and ready-made pasta. Try more plant-protein sources, such as tofu, tempeh, black beans, edamame, lentils, nuts, and seeds. Explore eating plans such as the Mediterranean diet or vegetarian diet. Try heart-healthy dips made with beans and healthy fats like hummus and guacamole. Vegetables go great with these. Cooking Use oil to saut or stir-fry foods instead of solid fats such as butter, margarine, or lard. Try baking, boiling, grilling, or broiling instead of frying. Remove the fatty part of meats before cooking. Steam vegetables in water or broth. Meal planning  At meals, imagine dividing your plate into fourths: One-half of your plate is fruits and vegetables. One-fourth of your plate is whole grains. One-fourth of your plate is protein, especially lean meats, poultry, eggs, tofu, beans, or nuts. Include low-fat dairy as part of your daily diet. Lifestyle Choose healthy options in all settings, including home, work, school, restaurants, or stores. Prepare your food safely: Wash your hands after handling raw meats. Where you prepare food, keep surfaces clean by regularly washing with hot, soapy water. Keep raw meats separate from ready-to-eat foods, such as fruits and vegetables. Cook seafood, meat, poultry, and eggs  to the recommended temperature. Get a food thermometer. Store foods at safe temperatures. In general: Keep cold foods at 44F (4.4C) or below. Keep hot foods at 144F (60C) or above. Keep your freezer at [facility] (-17.8C) or below. Foods are not safe to eat if they have been between the temperatures of 40-144F (4.4-60C) for more than 2 hours. What foods should I eat? Fruits Aim to eat 1-2 cups of fresh, canned (in natural juice), or frozen fruits each day. One cup of fruit equals 1 small apple, 1 large banana, 8 large strawberries, 1 cup (237 g) canned fruit,  cup (82 g) dried fruit, or 1 cup (240 mL) 100% juice. Vegetables Aim to eat 2-4 cups of fresh and frozen vegetables each day, including different varieties and colors. One cup of vegetables equals 1 cup (91 g) broccoli or cauliflower florets, 2 medium carrots, 2 cups (150 g) raw, leafy greens, 1 large tomato, 1 large bell pepper, 1 large sweet potato, or 1 medium white potato. Grains Aim to eat 5-10 ounce-equivalents of whole grains each day. Examples of 1 ounce-equivalent of grains include 1 slice of bread, 1 cup (40 g) ready-to-eat cereal, 3 cups (24 g) popcorn, or  cup (93 g) cooked rice. Meats and other proteins Try to eat 5-7 ounce-equivalents of protein each day. Examples of 1 ounce-equivalent of protein include 1 egg,  oz nuts (12 almonds, 24 pistachios, or 7 walnut halves), 1/4 cup (90 g) cooked beans, 6 tablespoons (90  g) hummus or 1 tablespoon (16 g) peanut butter. A cut of meat or fish that is the size of a deck of cards is about 3-4 ounce-equivalents (85 g). Of the protein you eat each week, try to have at least 8 sounce (227 g) of seafood. This is about 2 servings per week. This includes salmon, trout, herring, sardines, and anchovies. Dairy Aim to eat 3 cup-equivalents of fat-free or low-fat dairy each day. Examples of 1 cup-equivalent of dairy include 1 cup (240 mL) milk, 8 ounces (250 g) yogurt, 1 ounces (44 g) natural  cheese, or 1 cup (240 mL) fortified soy milk. Fats and oils Aim for about 5 teaspoons (21 g) of fats and oils per day. Choose monounsaturated fats, such as canola and olive oils, mayonnaise made with olive oil or avocado oil, avocados, peanut butter, and most nuts, or polyunsaturated fats, such as sunflower, corn, and soybean oils, walnuts, pine nuts, sesame seeds, sunflower seeds, and flaxseed. Beverages Aim for 6 eight-ounce glasses of water per day. Limit coffee to 3-5 eight-ounce cups per day. Limit caffeinated beverages that have added calories, such as soda and energy drinks. If you drink alcohol: Limit how much you have to: 0-1 drink a day if you are female. 0-2 drinks a day if you are female. Know how much alcohol is in your drink. In the [place] one drink is one 12 oz bottle of beer (355 mL), one 5 oz glass of wine (148 mL), or one 1 oz glass of hard liquor (44 mL). Seasoning and other foods Try not to add too much salt to your food. Try using herbs and spices instead of salt. Try not to add sugar to food. This information is based on [place] nutrition guidelines. To learn more, visit DisposableNylon.be. Exact amounts may vary. You may need different amounts. This information is not intended to replace advice given to you by your health care provider. Make sure you discuss any questions you have with your health care provider. Document Revised: 12/17/2021 Document Reviewed: 12/17/2021 Elsevier Patient Education  2024 [employer].

## 2023-12-22 ENCOUNTER — Telehealth: Payer: Self-pay | Admitting: *Deleted

## 2023-12-22 NOTE — Telephone Encounter (Signed)
 Mammogram appointment reminder mailed to patient/ appt: 12/24/2023 @ 12:00 [facility] [phone].

## 2023-12-24 ENCOUNTER — Ambulatory Visit: Payer: Self-pay | Admitting: Licensed Clinical Social Worker

## 2023-12-24 ENCOUNTER — Ambulatory Visit
Admission: RE | Admit: 2023-12-24 | Discharge: 2023-12-24 | Disposition: A | Discharge status: Home / Self Care | Source: Ambulatory Visit | Attending: Internal Medicine | Admitting: Internal Medicine

## 2023-12-24 DIAGNOSIS — Z1231 Encounter for screening mammogram for malignant neoplasm of breast: Secondary | ICD-10-CM

## 2023-12-24 DIAGNOSIS — F4320 Adjustment disorder, unspecified: Secondary | ICD-10-CM

## 2023-12-25 NOTE — BH Specialist Note (Signed)
 [facility] contacted patient. Appointment was rescheduled to 10/01. Patient denied SI, Hi, VH, AH. Nothing additional was discussed during encounter.   Renda Pontes, MSW, LCSW-A She/Her Behavioral Health Clinician [facility]  Internal Medicine Center

## 2023-12-30 DIAGNOSIS — H0289 Other specified disorders of eyelid: Secondary | ICD-10-CM | POA: Diagnosis not present

## 2023-12-30 DIAGNOSIS — H2513 Age-related nuclear cataract, bilateral: Secondary | ICD-10-CM | POA: Diagnosis not present

## 2023-12-30 DIAGNOSIS — H25013 Cortical age-related cataract, bilateral: Secondary | ICD-10-CM | POA: Diagnosis not present

## 2023-12-31 ENCOUNTER — Telehealth: Payer: Self-pay | Admitting: Licensed Clinical Social Worker

## 2023-12-31 ENCOUNTER — Ambulatory Visit: Payer: Self-pay | Admitting: Licensed Clinical Social Worker

## 2023-12-31 DIAGNOSIS — F4320 Adjustment disorder, unspecified: Secondary | ICD-10-CM

## 2023-12-31 NOTE — Telephone Encounter (Signed)
 Received email on today by management that patients insurance requires in person visit and not telephone.   Brenda Powell, MSW, LCSW-A She/Her Behavioral Health Clinician [facility]  Internal Medicine Center

## 2023-12-31 NOTE — BH Specialist Note (Signed)
 [facility] briefly spoke with patient. Patient requested to reschedule appointment to 10/20.  Renda Pontes, MSW, LCSW-A She/Her Behavioral Health Clinician [facility]  Internal Medicine Center

## 2024-01-19 ENCOUNTER — Ambulatory Visit: Admitting: Licensed Clinical Social Worker

## 2024-01-19 DIAGNOSIS — F4320 Adjustment disorder, unspecified: Secondary | ICD-10-CM

## 2024-01-28 ENCOUNTER — Ambulatory Visit (INDEPENDENT_AMBULATORY_CARE_PROVIDER_SITE_OTHER): Admitting: Licensed Clinical Social Worker

## 2024-01-28 DIAGNOSIS — F4320 Adjustment disorder, unspecified: Secondary | ICD-10-CM

## 2024-02-15 DIAGNOSIS — J189 Pneumonia, unspecified organism: Secondary | ICD-10-CM | POA: Diagnosis not present

## 2024-02-15 DIAGNOSIS — J069 Acute upper respiratory infection, unspecified: Secondary | ICD-10-CM | POA: Diagnosis not present

## 2024-02-15 DIAGNOSIS — R059 Cough, unspecified: Secondary | ICD-10-CM | POA: Diagnosis not present

## 2024-02-17 NOTE — Patient Instructions (Signed)
 Brenda Powell

## 2024-02-17 NOTE — BH Specialist Note (Unsigned)
 Integrated Behavioral Health Follow Up In-Person Visit  MRN: [id number] Name: Kaileia Flow  Number of Integrated Behavioral Health Clinician visits: No data recorded Session Start time: 1430   Session End time: 1530  Total time in minutes: 60    Types of Service: {CHL AMB TYPE OF SERVICE:[phone]}  Interpretor:{yes [id number]} Interpretor Name and Language: ***  Subjective: Jarelly Rinck is a 73 y.o. female accompanied by {Patient accompanied [phone]} Patient was referred by *** for ***. Patient reports the following symptoms/concerns: *** Duration of problem: ***; Severity of problem: {Mild/Moderate/Severe:20260}  Objective: Mood: {BHH MOOD:22306} and Affect: {BHH AFFECT:22307} Risk of harm to self or others: {CHL AMB BH Suicide Current Mental Status:21022748}  Life Context: Family and Social: *** School/Work: *** Self-Care: *** Life Changes: ***  Patient and/or Family's Strengths/Protective Factors: {CHL AMB BH PROTECTIVE [phone]}  Goals Addressed: Patient will:  Reduce symptoms of: {IBH Symptoms:21014056}   Increase knowledge and/or ability of: {IBH Patient Tools:21014057}   Demonstrate ability to: {IBH Goals:21014053}  Progress towards Goals: {CHL AMB BH PROGRESS TOWARDS GOALS:[phone]}  Interventions: Interventions utilized:  {IBH Interventions:21014054} Standardized Assessments completed: {IBH Screening Tools:21014051}      Patient and/or Family Response: ***  Patient Centered Plan: Patient is on the following Treatment Plan(s): ***  Clinical Assessment/Diagnosis  No diagnosis found.    Assessment: Patient currently experiencing ***.   Patient may benefit from ***.  Plan: Follow up with behavioral health clinician on : *** Behavioral recommendations: *** Referral(s): {IBH Referrals:21014055}  Renda Vella Pontes

## 2024-02-17 NOTE — BH Specialist Note (Signed)
 Integrated Behavioral Health via Telemedicine Visit   Brenda Powell [id number]  Number of Integrated Behavioral Health Clinician visits: No data recorded Session Start time: 1500   Session End time: 1600  Total time in minutes: 60    Referring Provider: PCP Patient/Family location: Home [facility] Provider location: Office All persons participating in visit: Patient and [facility] Types of Service: Individual psychotherapy and General Behavioral Integrated Care (BHI)  I connected with Brenda Powell a via  Telephone  and verified that I am speaking with the correct person using two identifiers. Discussed confidentiality: Yes   I discussed the limitations of telemedicine and the availability of in person appointments.  Discussed there is a possibility of technology failure and discussed alternative modes of communication if that failure occurs.  I discussed that engaging in this telemedicine visit, they consent to the provision of behavioral healthcare and the services will be billed under their insurance.  Patient and/or legal guardian expressed understanding and consented to Telemedicine visit: Yes   Presenting Concerns: Patient and/or family reports the following symptoms/concerns: brief psychoeducation on grief related to the loss of her spouse and coping skills. Therapist explained that grief is a natural response to losing a loved one and can include sadness, anger, numbness, guilt and fatigue that often come in waves. Her reactions were normalized. Coping skills to consider are naming and allowing feelings, talking with trusted supports, journaling or , keeping basic routines for sleep, meals and movement, using slow breathing and grounding when emotions feel intense and creating small personal rituals to honor her spouse. [facility] also placed a referral for grief counseling with [employer].   Patient and/or Family's Strengths/Protective Factors: Social connections  Goals  Addressed: Patient will:  Reduce symptoms of: Grief    Progress towards Goals: Ongoing    Interventions: Interventions utilized:  Psychoeducation and/or Health Education Standardized Assessments completed: Not Needed    Patient and/or Family Response: Patient agrees to ongoing services  Clinical Assessment/Diagnosis  Grief reaction    Assessment: Patient currently experiencing Grief.   Patient may benefit from Ongoing OPT.  Plan: Follow up with behavioral health clinician on : Patient will contact office to schedule   I discussed the assessment and treatment plan with the patient and/or parent/guardian. They were provided an opportunity to ask questions and all were answered. They agreed with the plan and demonstrated an understanding of the instructions.   They were advised to call back or seek an in-person evaluation if the symptoms worsen or if the condition fails to improve as anticipated.  Renda Pontes, MSW, LCSW-A She/Her Behavioral Health Clinician [facility]  Internal Medicine Center

## 2024-02-19 DIAGNOSIS — M19031 Primary osteoarthritis, right wrist: Secondary | ICD-10-CM | POA: Diagnosis not present

## 2024-02-19 DIAGNOSIS — M65341 Trigger finger, right ring finger: Secondary | ICD-10-CM | POA: Diagnosis not present

## 2024-02-19 NOTE — Patient Instructions (Signed)
 SABRA

## 2024-02-20 ENCOUNTER — Telehealth: Payer: Self-pay | Admitting: Internal Medicine

## 2024-02-20 NOTE — Telephone Encounter (Signed)
 Message has been forwarded to the referral coordinator, due to Renda Pontes does not have an available appointment at this time.  Copied from CRM [id number]. Topic: Appointments - Scheduling Inquiry for Clinic >> Feb 19, 2024 10:32 AM Diannia H wrote: Reason for CRM: Patient is wanting to make an appointment with Renda Pontes. Could you assist? Patients callback number is [phone].

## 2024-02-25 DIAGNOSIS — M65341 Trigger finger, right ring finger: Secondary | ICD-10-CM | POA: Diagnosis not present

## 2024-02-25 DIAGNOSIS — M67441 Ganglion, right hand: Secondary | ICD-10-CM | POA: Diagnosis not present

## 2024-03-03 ENCOUNTER — Other Ambulatory Visit

## 2024-03-10 DIAGNOSIS — H6691 Otitis media, unspecified, right ear: Secondary | ICD-10-CM | POA: Diagnosis not present

## 2024-03-10 DIAGNOSIS — J029 Acute pharyngitis, unspecified: Secondary | ICD-10-CM | POA: Diagnosis not present

## 2024-03-10 DIAGNOSIS — R051 Acute cough: Secondary | ICD-10-CM | POA: Diagnosis not present

## 2024-03-15 ENCOUNTER — Other Ambulatory Visit: Payer: Self-pay | Admitting: Internal Medicine

## 2024-03-15 MED ORDER — ATORVASTATIN CALCIUM 80 MG PO TABS: ORAL_TABLET | ORAL | 3 refills | Status: AC

## 2024-03-15 NOTE — Telephone Encounter (Signed)
 Copied from CRM #[id number]. Topic: Clinical - Medication Refill >> Mar 15, 2024 11:15 AM Debby BROCKS wrote: Medication: atorvastatin  (LIPITOR) 80 MG tablet  Has the patient contacted their pharmacy? Yes (Agent: If no, request that the patient contact the pharmacy for the refill. If patient does not wish to contact the pharmacy document the reason why and proceed with request.) (Agent: If yes, when and what did the pharmacy advise?)  This is the patient's preferred pharmacy:  [id number] - [place], [place] - [address] [place] [address] [place] [address] [place] [address] Phone: [phone] Fax: [phone]  Is this the correct pharmacy for this prescription? Yes If no, delete pharmacy and type the correct one.   Has the prescription been filled recently? No  Is the patient out of the medication? No, 2 pills left  Has the patient been seen for an appointment in the last year OR does the patient have an upcoming appointment? Yes  Can we respond through MyChart? Yes  Agent: Please be advised that Rx refills may take up to 3 business days. We ask that you follow-up with your pharmacy.

## 2024-03-29 ENCOUNTER — Ambulatory Visit: Payer: Self-pay

## 2024-03-29 NOTE — Telephone Encounter (Signed)
 FYI Only or Action Required?: FYI only for provider: appointment scheduled on 03/30/24.  Patient was last seen in primary care on 11/25/2023 by Karna Fellows, MD.  Called Nurse Triage reporting Cough.  Symptoms began about a month ago.  Interventions attempted: Prescription medications: abx, inhaler, cough medicine.  Symptoms are: gradually worsening.  Triage Disposition: See Physician Within 24 Hours  Patient/caregiver understands and will follow disposition?:   Reason for Disposition  SEVERE coughing spells (e.g., whooping sound after coughing, vomiting after coughing)  Answer Assessment - Initial Assessment Questions Pt called in with worsening cough. Pt states she was diagnosed with pneumonia mid November  - took abx and prednisone and felt better. Early December started feeling sick again - abx, cough medicine, inhaler prescribed. Now getting worse - no energy, coughing so much to the point of throwing up.  Other symptoms - Sweats, chills, sore throat, runny nose, cough.  Denies fever.  Pt scheduled for tomorrow 03/30/24.   1. ONSET: When did the cough begin?      Since early December 2. SEVERITY: How bad is the cough today?      Bad to the point of vomiting  3. SPUTUM: Describe the color of your sputum (e.g., none, dry cough; clear, white, yellow, green)     Clear/white 4. HEMOPTYSIS: Are you coughing up any blood? If Yes, ask: How much? (e.g., flecks, streaks, tablespoons, etc.)     Denies  5. DIFFICULTY BREATHING: Are you having difficulty breathing? If Yes, ask: How bad is it? (e.g., mild, moderate, severe)      Only when coughing 6. FEVER: Do you have a fever? If Yes, ask: What is your temperature, how was it measured, and when did it start?     Denies  7. CARDIAC HISTORY: Do you have any history of heart disease? (e.g., heart attack, congestive heart failure)      yes 8. LUNG HISTORY: Do you have any history of lung disease?  (e.g., pulmonary  embolus, asthma, emphysema)     N/a 9. PE RISK FACTORS: Do you have a history of blood clots? (or: recent major surgery, recent prolonged travel, bedridden)     N/a 10. OTHER SYMPTOMS: Do you have any other symptoms? (e.g., runny nose, wheezing, chest pain)       Runny nose  Protocols used: Cough - Acute Productive-A-AH  Copied from CRM #[id number]. Topic: Clinical - Red Word Triage >> Mar 29, 2024  8:25 AM Antonio DEL wrote: Red Word that prompted transfer to Nurse Triage: Productive cough, coughs so much sometimes that she throws up. Has been using inhaler and prednisone seemed like it helped at first but now, starting to build back up. Can feel it in her chest, has difficulty breathing sometimes. Went to about 4 different urgent cares yesterday and couldn't get help.

## 2024-03-30 ENCOUNTER — Ambulatory Visit: Payer: Self-pay | Admitting: Student

## 2024-04-05 NOTE — Telephone Encounter (Signed)
 Pt canceled appt om 03/30/24.
